# Patient Record
Sex: Female | Born: 1970 | Race: White | Hispanic: No | Marital: Married | Smoking: Former smoker
Health system: Southern US, Community
[De-identification: ages and names within clinical notes are randomized; demographics above are authoritative.]

## PROBLEM LIST (undated history)

## (undated) DIAGNOSIS — E079 Disorder of thyroid, unspecified: Secondary | ICD-10-CM

## (undated) DIAGNOSIS — E785 Hyperlipidemia, unspecified: Secondary | ICD-10-CM

## (undated) HISTORY — DX: Hyperlipidemia, unspecified: E78.5

## (undated) HISTORY — PX: CYST REMOVAL HAND: SHX6279

## (undated) HISTORY — PX: OVARIAN CYST REMOVAL: SHX89

## (undated) HISTORY — PX: APPENDECTOMY: SHX54

## (undated) HISTORY — DX: Disorder of thyroid, unspecified: E07.9

## (undated) HISTORY — PX: CHOLECYSTECTOMY: SHX55

---

## 2019-10-15 ENCOUNTER — Encounter: Payer: Self-pay | Admitting: Internal Medicine

## 2019-10-15 ENCOUNTER — Ambulatory Visit (INDEPENDENT_AMBULATORY_CARE_PROVIDER_SITE_OTHER): Payer: No Typology Code available for payment source | Admitting: Internal Medicine

## 2019-10-15 ENCOUNTER — Other Ambulatory Visit: Payer: Self-pay

## 2019-10-15 VITALS — BP 132/84 | HR 96 | Resp 16 | Ht 71.0 in | Wt 220.0 lb

## 2019-10-15 DIAGNOSIS — Z1159 Encounter for screening for other viral diseases: Secondary | ICD-10-CM

## 2019-10-15 DIAGNOSIS — Z114 Encounter for screening for human immunodeficiency virus [HIV]: Secondary | ICD-10-CM

## 2019-10-15 DIAGNOSIS — E782 Mixed hyperlipidemia: Secondary | ICD-10-CM

## 2019-10-15 DIAGNOSIS — R233 Spontaneous ecchymoses: Secondary | ICD-10-CM

## 2019-10-15 DIAGNOSIS — E669 Obesity, unspecified: Secondary | ICD-10-CM | POA: Diagnosis not present

## 2019-10-15 DIAGNOSIS — Z7689 Persons encountering health services in other specified circumstances: Secondary | ICD-10-CM | POA: Diagnosis not present

## 2019-10-15 DIAGNOSIS — Z1231 Encounter for screening mammogram for malignant neoplasm of breast: Secondary | ICD-10-CM

## 2019-10-15 NOTE — Patient Instructions (Addendum)
Check CBC, CMP, lipid panel, A1c, HIV & hepatitis c screening  on Monday am with fasting Follow up in 4 weeks-will do pap smear on next visit Lost 60 lb--proud of you!!! Keep doing your hard work.

## 2019-10-15 NOTE — Progress Notes (Addendum)
New Patient Office Visit  Subjective:  Patient ID: Terri Flores, female    DOB: 08-20-1970  Age: 49 y.o. MRN: [id number]  CC:  Chief Complaint  Patient presents with  . New Patient (Initial Visit)    establish care  . Rash    rash on right foot/may be stipling has been there x 1 year    HPI Terri Flores is 49 year old female with past medical history of mixed hyperlipidemia, obesity presents in our clinic for the first time to establish care with [place].  Patient tells me that she moved recently from [place] in July 2020.  She has elevated total cholesterol, triglycerides and LDL however she does not take any medication.  She is working on diet and exercise.  She tells me that she has lost 60 pounds in 1 year.  She takes multiple over-the-counter vitamins.  She tells me that she has a rash on both feet since 1 year.  No aggravating or relieving factors, denies association with pain, itching, swelling, fever, chills, trauma.  She does not have history of ITP/bleeding tendency/does not take over-the-counter NSAID.  She is not on anticoagulation or family history of bleeding disorder.  She does not smoke cigarettes, drinks alcohol or illicit drug use.  She is sexually active with one female partner, no history of STD.  She has no kids.  LMP: 17th July.  She exercises 90 minutes every day.  She is up-to-date on COVID-19 vaccine.  She is due for Pap smear and mammogram.  Past Medical History:  Diagnosis Date  . Hyperlipidemia     Past Surgical History:  Procedure Laterality Date  . APPENDECTOMY    . CHOLECYSTECTOMY    . CYST REMOVAL HAND Left   . OVARIAN CYST REMOVAL Bilateral     Family History  Problem Relation Age of Onset  . Obesity Mother   . Diabetes Mother   . Hyperlipidemia Mother   . Obesity Sister   . Cancer Brother   . Hyperlipidemia Brother   . Obesity Brother     Social History   Socioeconomic History  . Marital status: Married    Spouse name: Not on file    . Number of children: Not on file  . Years of education: Not on file  . Highest education level: Not on file  Occupational History  . Not on file  Tobacco Use  . Smoking status: Former Games developer  . Smokeless tobacco: Never Used  Substance and Sexual Activity  . Alcohol use: Not Currently  . Drug use: Never  . Sexual activity: Not Currently  Other Topics Concern  . Not on file  Social History Narrative  . Not on file   Social Determinants of Health   Financial Resource Strain:   . Difficulty of Paying Living Expenses:   Food Insecurity:   . Worried About Programme researcher, broadcasting/film/video in the Last Year:   . Barista in the Last Year:   Transportation Needs:   . Freight forwarder (Medical):   Marland Kitchen Lack of Transportation (Non-Medical):   Physical Activity:   . Days of Exercise per Week:   . Minutes of Exercise per Session:   Stress:   . Feeling of Stress :   Social Connections:   . Frequency of Communication with Friends and Family:   . Frequency of Social Gatherings with Friends and Family:   . Attends Religious Services:   . Active Member of Clubs or Organizations:   .  Attends Banker Meetings:   Marland Kitchen Marital Status:   Intimate Partner Violence:   . Fear of Current or Ex-Partner:   . Emotionally Abused:   Marland Kitchen Physically Abused:   . Sexually Abused:     ROS Review of Systems  Constitutional: Negative.   HENT: Negative.   Eyes: Negative.   Respiratory: Negative.   Cardiovascular: Negative.   Gastrointestinal: Negative.   Endocrine: Negative.   Genitourinary: Negative.   Musculoskeletal: Negative.   Skin: Positive for rash.  Allergic/Immunologic: Negative.   Neurological: Negative.   Hematological: Negative.   Psychiatric/Behavioral: Negative.     Objective:   Today's Vitals: BP (!) 132/84   Pulse 96   Resp 16   Ht 5\' 11"  (1.803 m)   Wt (!) 220 lb (99.8 kg)   SpO2 97%   BMI 30.68 kg/m   Physical Exam Constitutional:      Appearance: Normal  appearance.  HENT:     Head: Normocephalic and atraumatic.     Nose: Nose normal.     Mouth/Throat:     Mouth: Mucous membranes are moist.  Eyes:     Extraocular Movements: Extraocular movements intact.     Conjunctiva/sclera: Conjunctivae normal.     Pupils: Pupils are equal, round, and reactive to light.  Cardiovascular:     Rate and Rhythm: Normal rate and regular rhythm.     Pulses: Normal pulses.     Heart sounds: Normal heart sounds.  Pulmonary:     Effort: Pulmonary effort is normal.     Breath sounds: Normal breath sounds.  Abdominal:     General: Abdomen is flat. Bowel sounds are normal.     Palpations: Abdomen is soft.  Musculoskeletal:        General: Normal range of motion.     Cervical back: Normal range of motion and neck supple.  Skin:    Comments: Petechial/nonblanchable rash noted on extensor surface of both feet toes.  Nontender, no ankle swelling noted.  Neurological:     Mental Status: She is alert.     Assessment & Plan:   Problem List Items Addressed This Visit    None    Visit Diagnoses    Encounter to establish care    -  Primary   Mixed hyperlipidemia       Obesity (BMI 30.0-34.9)       Encounter for screening mammogram for breast cancer       Relevant Orders   MM Digital Screening   Petechial rash       Encounter for screening for HIV       Need for hepatitis C screening test         Encounter to establish care: Care established -Check CBC, CMP, lipid panel, A1c on Monday morning -PHQ-9 score: 1. Pt is doing well, no Hx of low/depressed mood.  Petechial rash: Nonblanchable on exam.  Nontender. -Involving toes of both feet -Unknown etiology.  Check CBC-check platelet count.  No history of ITP/bleeding disorder  Encounter for screening mammogram for breast cancer: Screening mammogram ordered  Mixed hyperlipidemia: Reviewed lipid panel from 06/08/2018 which showed total cholesterol of 240, triglycerides: 107, LDL: 174, HDL: 45. -Repeat  lipid panel today.  Obesity with BMI of 30.  Patient tells me that she has lost 60 pounds in 1 year -Continue diet modification, exercise and weight loss  Encounter for HIV/hepatitis C screening: Test ordered  Will perform Pap smear on next visit.  Outpatient Encounter Medications  as of 10/15/2019  Medication Sig  . B Complex Vitamins (B COMPLEX PO) Take 1 Dose by mouth daily.  . Cyanocobalamin (VITAMIN B-12 PO) Take 1 Dose by mouth daily.  Marland Kitchen MAGNESIUM PO Take 1 capsule by mouth daily.  Gerome Sam Oleifera (MORINGA PO) Take 100 mLs by mouth daily.  . Multiple Vitamin (MULTIVITAMIN ADULT PO) Take 1 tablet by mouth daily.  . Omega-3 Fatty Acids (OMEGA 3 PO) Take 1 Dose by mouth in the morning and at bedtime. W/ black pepper  . OVER THE COUNTER MEDICATION Take 1 Dose by mouth daily. Fiber Supplement  . OVER THE COUNTER MEDICATION Take 1 Dose by mouth daily. Glucosamine chondrotin  . pyridOXINE (VITAMIN B-6) 100 MG tablet Take 100 mg by mouth daily.  . Pyridoxine HCl (VITAMIN B-6 PO) Take 1 Dose by mouth daily.  . TURMERIC PO Take 1 Dose by mouth daily.   No facility-administered encounter medications on file as of 10/15/2019.    Follow-up: No follow-ups on file.   Ollen Bowl, MD

## 2019-10-19 LAB — CMP14+EGFR
ALT: 23 IU/L (ref 0–32)
AST: 19 IU/L (ref 0–40)
Albumin/Globulin Ratio: 1.7 (ref 1.2–2.2)
Albumin: 4.7 g/dL (ref 3.8–4.8)
Alkaline Phosphatase: 56 IU/L (ref 48–121)
BUN/Creatinine Ratio: 15 (ref 9–23)
BUN: 12 mg/dL (ref 6–24)
Bilirubin Total: 0.5 mg/dL (ref 0.0–1.2)
CO2: 23 mmol/L (ref 20–29)
Calcium: 9.6 mg/dL (ref 8.7–10.2)
Chloride: 100 mmol/L (ref 96–106)
Creatinine, Ser: 0.79 mg/dL (ref 0.57–1.00)
GFR calc Af Amer: 102 mL/min/{1.73_m2} (ref >59)
GFR calc non Af Amer: 89 mL/min/{1.73_m2} (ref >59)
Globulin, Total: 2.8 g/dL (ref 1.5–4.5)
Glucose: 95 mg/dL (ref 65–99)
Potassium: 4.1 mmol/L (ref 3.5–5.2)
Sodium: 137 mmol/L (ref 134–144)
Total Protein: 7.5 g/dL (ref 6.0–8.5)

## 2019-10-19 LAB — LIPID PANEL
Chol/HDL Ratio: 4.2 ratio (ref 0.0–4.4)
Cholesterol, Total: 284 mg/dL — ABNORMAL HIGH (ref 100–199)
HDL: 68 mg/dL (ref >39)
LDL Chol Calc (NIH): 193 mg/dL — ABNORMAL HIGH (ref 0–99)
Triglycerides: 128 mg/dL (ref 0–149)
VLDL Cholesterol Cal: 23 mg/dL (ref 5–40)

## 2019-10-19 LAB — CBC
Hematocrit: 42.9 % (ref 34.0–46.6)
Hemoglobin: 14.4 g/dL (ref 11.1–15.9)
MCH: 30.8 pg (ref 26.6–33.0)
MCHC: 33.6 g/dL (ref 31.5–35.7)
MCV: 92 fL (ref 79–97)
Platelets: 165 10*3/uL (ref 150–450)
RBC: 4.68 x10E6/uL (ref 3.77–5.28)
RDW: 11.8 % (ref 11.7–15.4)
WBC: 5.2 10*3/uL (ref 3.4–10.8)

## 2019-10-19 LAB — HEMOGLOBIN A1C
Est. average glucose Bld gHb Est-mCnc: 108 mg/dL
Hgb A1c MFr Bld: 5.4 % (ref 4.8–5.6)

## 2019-10-19 LAB — HEPATITIS C ANTIBODY: Hep C Virus Ab: <0.1 s/co ratio (ref 0.0–0.9)

## 2019-10-19 LAB — HIV ANTIBODY (ROUTINE TESTING W REFLEX): HIV Screen 4th Generation wRfx: NONREACTIVE

## 2019-10-26 ENCOUNTER — Telehealth (HOSPITAL_COMMUNITY): Payer: Self-pay | Admitting: Internal Medicine

## 2019-10-26 ENCOUNTER — Telehealth: Payer: Self-pay | Admitting: Family Medicine

## 2019-10-26 NOTE — Telephone Encounter (Signed)
Patient is returning a call regarding lab results please call back at [phone]

## 2019-10-26 NOTE — Telephone Encounter (Signed)
Pt aware of results 

## 2019-12-20 ENCOUNTER — Ambulatory Visit (INDEPENDENT_AMBULATORY_CARE_PROVIDER_SITE_OTHER): Payer: No Typology Code available for payment source | Admitting: Internal Medicine

## 2019-12-20 ENCOUNTER — Other Ambulatory Visit: Payer: Self-pay

## 2019-12-20 ENCOUNTER — Other Ambulatory Visit: Payer: Self-pay | Admitting: *Deleted

## 2019-12-20 ENCOUNTER — Encounter (INDEPENDENT_AMBULATORY_CARE_PROVIDER_SITE_OTHER): Payer: Self-pay | Admitting: *Deleted

## 2019-12-20 ENCOUNTER — Encounter: Payer: Self-pay | Admitting: Internal Medicine

## 2019-12-20 VITALS — BP 125/82 | HR 76 | Temp 97.5°F | Resp 16 | Ht 71.0 in | Wt 215.4 lb

## 2019-12-20 DIAGNOSIS — Z124 Encounter for screening for malignant neoplasm of cervix: Secondary | ICD-10-CM

## 2019-12-20 DIAGNOSIS — Z0000 Encounter for general adult medical examination without abnormal findings: Secondary | ICD-10-CM | POA: Diagnosis not present

## 2019-12-20 DIAGNOSIS — Z1211 Encounter for screening for malignant neoplasm of colon: Secondary | ICD-10-CM

## 2019-12-20 DIAGNOSIS — Z23 Encounter for immunization: Secondary | ICD-10-CM | POA: Diagnosis not present

## 2019-12-20 DIAGNOSIS — E669 Obesity, unspecified: Secondary | ICD-10-CM

## 2019-12-20 DIAGNOSIS — R233 Spontaneous ecchymoses: Secondary | ICD-10-CM

## 2019-12-20 DIAGNOSIS — Z0001 Encounter for general adult medical examination with abnormal findings: Secondary | ICD-10-CM | POA: Insufficient documentation

## 2019-12-20 DIAGNOSIS — E782 Mixed hyperlipidemia: Secondary | ICD-10-CM

## 2019-12-20 DIAGNOSIS — L309 Dermatitis, unspecified: Secondary | ICD-10-CM | POA: Diagnosis not present

## 2019-12-20 MED ORDER — MOMETASONE FUROATE 0.1 % EX CREA: TOPICAL_CREAM | CUTANEOUS | 1 refills | Status: DC | PRN

## 2019-12-20 NOTE — Patient Instructions (Addendum)
You are advised to continue to perform moderate exercise, 30 mins/day for at least 5 days in a week.  Please follow low-carbohydrate, low-cholesterol diet as discussed. We will repeat your blood tests before next visit.  Please apply lotion to avoid dry skin and sunscreen before outdoor activities.  You will be scheduled for Mammography.

## 2019-12-20 NOTE — Assessment & Plan Note (Signed)
Lipid profile discussed with the patient Advised to statin therapy, did not tolerate statin in the past, patient prefers diet modification for now Will recheck lipid profile before next visit

## 2019-12-20 NOTE — Assessment & Plan Note (Signed)
Unclear etiology, likely capillaritis, platelets wnl Unchanged from prior visit No signs of active bleeding or itching Will monitor for now

## 2019-12-20 NOTE — Assessment & Plan Note (Signed)
Borderline Advised low-carbohydrate, low-cholesterol diet and moderate exercise

## 2019-12-20 NOTE — Progress Notes (Signed)
Established Patient Office Visit  Subjective:  Patient ID: Terri Flores, female    DOB: [birth year]  Age: 49 y.o. MRN: [id number]  CC:  Chief Complaint  Patient presents with  . Follow-up    4 week follow up has spots on feet they were here last visit however they havent went away also would like to go over labs from last visit     HPI Terri Flores is 49 year old female with past medical history of hyperlipidemia, eczema and obesity presents for annual physical exam.  Patient complains of noticing brownish spots on the left foot, more pronounced on the medial side around the ankle and on the toes.  She states that they are not painful or itchy.  Patient denies any history of bleeding around the site or bruising.  She denies fever, chills, fatigue, night sweats, headache, neck pain, nausea, vomiting, constipation, diarrhea, abdominal pain, dysuria, hematuria, or LE edema.  Upon discussion of her lipid profile, patient states that she used to eat fish as a majority portion of her diet and her cholesterol numbers were getting better before she moved to this area.  She has been having more fried food recently.  She states that she had generalized fatigue with atorvastatin in the past and does not want to take cholesterol medication for now and prefers to do diet modification and exercise.  Patient has not had a mammography yet as she did not receive a call for scheduling it.  Past Medical History:  Diagnosis Date  . Hyperlipidemia     Past Surgical History:  Procedure Laterality Date  . APPENDECTOMY    . CHOLECYSTECTOMY    . CYST REMOVAL HAND Left   . OVARIAN CYST REMOVAL Bilateral     Family History  Problem Relation Age of Onset  . Obesity Mother   . Diabetes Mother   . Hyperlipidemia Mother   . Obesity Sister   . Cancer Brother   . Hyperlipidemia Brother   . Obesity Brother     Social History   Socioeconomic History  . Marital status: Married    Spouse name: Not  on file  . Number of children: Not on file  . Years of education: Not on file  . Highest education level: Not on file  Occupational History  . Not on file  Tobacco Use  . Smoking status: Former Research scientist (life sciences)  . Smokeless tobacco: Never Used  Substance and Sexual Activity  . Alcohol use: Not Currently  . Drug use: Never  . Sexual activity: Not Currently  Other Topics Concern  . Not on file  Social History Narrative  . Not on file   Social Determinants of Health   Financial Resource Strain:   . Difficulty of Paying Living Expenses: Not on file  Food Insecurity:   . Worried About Charity fundraiser in the Last Year: Not on file  . Ran Out of Food in the Last Year: Not on file  Transportation Needs:   . Lack of Transportation (Medical): Not on file  . Lack of Transportation (Non-Medical): Not on file  Physical Activity:   . Days of Exercise per Week: Not on file  . Minutes of Exercise per Session: Not on file  Stress:   . Feeling of Stress : Not on file  Social Connections:   . Frequency of Communication with Friends and Family: Not on file  . Frequency of Social Gatherings with Friends and Family: Not on file  . Attends Religious Services: Not  on file  . Active Member of Clubs or Organizations: Not on file  . Attends Archivist Meetings: Not on file  . Marital Status: Not on file  Intimate Partner Violence:   . Fear of Current or Ex-Partner: Not on file  . Emotionally Abused: Not on file  . Physically Abused: Not on file  . Sexually Abused: Not on file    Outpatient Medications Prior to Visit  Medication Sig Dispense Refill  . MAGNESIUM PO Take 1 capsule by mouth daily.    Edyth Gunnels Oleifera (MORINGA PO) Take 100 mLs by mouth daily.    . Multiple Vitamin (MULTIVITAMIN ADULT PO) Take 1 tablet by mouth daily.    . Omega-3 Fatty Acids (OMEGA 3 PO) Take 1 Dose by mouth in the morning and at bedtime. W/ black pepper    . OVER THE COUNTER MEDICATION Take 1 Dose by  mouth daily. Fiber Supplement    . OVER THE COUNTER MEDICATION Take 1 Dose by mouth daily. Glucosamine chondrotin    . TURMERIC PO Take 1 Dose by mouth daily.    . mometasone (ELOCON) 0.1 % cream     . B Complex Vitamins (B COMPLEX PO) Take 1 Dose by mouth daily.    . Cyanocobalamin (VITAMIN B-12 PO) Take 1 Dose by mouth daily. (Patient not taking: Reported on 12/20/2019)    . pyridOXINE (VITAMIN B-6) 100 MG tablet Take 100 mg by mouth daily.    . Pyridoxine HCl (VITAMIN B-6 PO) Take 1 Dose by mouth daily.     No facility-administered medications prior to visit.    Allergies  Allergen Reactions  . Other Nausea Only    Pain medication. Patient can't recall the name    ROS Review of Systems  Constitutional: Negative for chills and fever.  HENT: Negative for congestion, sinus pressure, sinus pain and sore throat.   Eyes: Negative for pain and discharge.  Respiratory: Negative for cough and shortness of breath.   Cardiovascular: Negative for chest pain and palpitations.  Gastrointestinal: Negative for abdominal pain, constipation, diarrhea, nausea and vomiting.  Endocrine: Negative for polydipsia and polyuria.  Genitourinary: Negative for dysuria and hematuria.  Musculoskeletal: Negative for neck pain and neck stiffness.  Skin: Positive for rash (Left foot).  Neurological: Negative for dizziness and weakness.  Psychiatric/Behavioral: Negative for agitation and behavioral problems.      Objective:    Physical Exam Vitals reviewed.  Constitutional:      General: She is not in acute distress.    Appearance: She is not diaphoretic.  HENT:     Head: Normocephalic and atraumatic.     Nose: Nose normal. No congestion.     Mouth/Throat:     Mouth: Mucous membranes are moist.     Pharynx: No posterior oropharyngeal erythema.  Eyes:     General: No scleral icterus.    Extraocular Movements: Extraocular movements intact.     Pupils: Pupils are equal, round, and reactive to light.    Cardiovascular:     Rate and Rhythm: Normal rate and regular rhythm.     Heart sounds: No murmur heard.   Pulmonary:     Breath sounds: Normal breath sounds. No wheezing or rales.  Abdominal:     Palpations: Abdomen is soft.     Tenderness: There is no abdominal tenderness.  Musculoskeletal:     Cervical back: Neck supple. No tenderness.     Right lower leg: No edema.     Left lower  leg: No edema.  Skin:    General: Skin is warm.     Comments: Left foot - Petechiae like spots noted on inferomedial side of knee and over toes, nonblanchable  Neurological:     General: No focal deficit present.     Mental Status: She is alert and oriented to person, place, and time.  Psychiatric:        Mood and Affect: Mood normal.        Behavior: Behavior normal.     BP 125/82 (BP Location: Right Arm, Patient Position: Sitting, Cuff Size: Normal)   Pulse 76   Temp (!) 97.5 F (36.4 C) (Temporal)   Resp 16   Ht _0  (1.803 m)   Wt 215 lb 6.4 oz (97.7 kg)   SpO2 97%   BMI 30.04 kg/m  Wt Readings from Last 3 Encounters:  12/20/19 215 lb 6.4 oz (97.7 kg)  10/15/19 (!) 220 lb (99.8 kg)     Health Maintenance Due  Topic Date Due  . TETANUS/TDAP  Never done  . PAP SMEAR-Modifier  Never done    There are no preventive care reminders to display for this patient.  No results found for: TSH Lab Results  Component Value Date   WBC 5.2 10/18/2019   HGB 14.4 10/18/2019   HCT 42.9 10/18/2019   MCV 92 10/18/2019   PLT 165 10/18/2019   Lab Results  Component Value Date   NA 137 10/18/2019   K 4.1 10/18/2019   CO2 23 10/18/2019   GLUCOSE 95 10/18/2019   BUN 12 10/18/2019   CREATININE 0.79 10/18/2019   BILITOT 0.5 10/18/2019   ALKPHOS 56 10/18/2019   AST 19 10/18/2019   ALT 23 10/18/2019   PROT 7.5 10/18/2019   ALBUMIN 4.7 10/18/2019   CALCIUM 9.6 10/18/2019   Lab Results  Component Value Date   CHOL 284 (H) 10/18/2019   Lab Results  Component Value Date   HDL 68  10/18/2019   Lab Results  Component Value Date   LDLCALC 193 (H) 10/18/2019   Lab Results  Component Value Date   TRIG 128 10/18/2019   Lab Results  Component Value Date   CHOLHDL 4.2 10/18/2019   Lab Results  Component Value Date   HGBA1C 5.4 10/18/2019      Assessment & Plan:   Problem List Items Addressed This Visit      Annual physical exam - Primary   Annual physical performed Preventive care discussed with the patient Needs Mammography, PAP smear and colonoscopy - referrals provided     Relevant Orders  CBC with Differential   Musculoskeletal and Integument   Petechial rash    Unclear etiology, likely capillaritis, platelets wnl Unchanged from prior visit No signs of active bleeding or itching Will monitor for now      Eczema    Well-controlled with Mometasone cream      Relevant Medications   mometasone (ELOCON) 0.1 % cream     Other   Mixed hyperlipidemia    Lipid profile discussed with the patient Advised to statin therapy, did not tolerate statin in the past, patient prefers diet modification for now Will recheck lipid profile before next visit      Relevant Orders   CMP14+EGFR   T4 AND TSH   Lipid Profile   Obesity (BMI 30.0-34.9)    Borderline Advised low-carbohydrate, low-cholesterol diet and moderate exercise  Other Visit Diagnoses    Need for immunization against influenza       Relevant Orders   Flu Vaccine QUAD 36+ mos IM (Completed)   Routine cervical smear       Relevant Orders   Ambulatory referral to Obstetrics / Gynecology   Special screening for malignant neoplasms, colon       Relevant Orders   Ambulatory referral to Gastroenterology      Meds ordered this encounter  Medications  . mometasone (ELOCON) 0.1 % cream    Sig: Apply topically as needed.    Dispense:  45 g    Refill:  1    Follow-up: Return in about 4 months (around 04/21/2020).    Lindell Spar, MD

## 2019-12-20 NOTE — Assessment & Plan Note (Addendum)
Annual exam as documented. Counseling done  re healthy lifestyle involving commitment to 150 minutes exercise per week, heart healthy diet, and attaining healthy weight.The importance of adequate sleep also discussed. Changes in health habits are decided on by the patient with goals and time frames  set for achieving them. Immunization and cancer screening needs are specifically addressed at this visit. Needs Mammography, PAP smear and colonoscopy - referrals provided

## 2019-12-20 NOTE — Assessment & Plan Note (Signed)
Well-controlled with Mometasone cream 

## 2020-01-05 ENCOUNTER — Encounter: Payer: Self-pay | Admitting: Adult Health

## 2020-01-05 ENCOUNTER — Ambulatory Visit (INDEPENDENT_AMBULATORY_CARE_PROVIDER_SITE_OTHER): Payer: No Typology Code available for payment source | Admitting: Adult Health

## 2020-01-05 ENCOUNTER — Other Ambulatory Visit (HOSPITAL_COMMUNITY)
Admission: RE | Admit: 2020-01-05 | Discharge: 2020-01-05 | Disposition: A | Discharge status: Home / Self Care | Payer: No Typology Code available for payment source | Source: Ambulatory Visit | Attending: Adult Health | Admitting: Adult Health

## 2020-01-05 VITALS — BP 136/93 | HR 88 | Ht 70.5 in | Wt 214.0 lb

## 2020-01-05 DIAGNOSIS — Z01419 Encounter for gynecological examination (general) (routine) without abnormal findings: Secondary | ICD-10-CM | POA: Insufficient documentation

## 2020-01-05 DIAGNOSIS — R232 Flushing: Secondary | ICD-10-CM | POA: Insufficient documentation

## 2020-01-05 DIAGNOSIS — N951 Menopausal and female climacteric states: Secondary | ICD-10-CM | POA: Diagnosis not present

## 2020-01-05 DIAGNOSIS — Z1211 Encounter for screening for malignant neoplasm of colon: Secondary | ICD-10-CM

## 2020-01-05 LAB — HEMOCCULT GUIAC POC 1CARD (OFFICE): Fecal Occult Blood, POC: NEGATIVE

## 2020-01-05 NOTE — Progress Notes (Signed)
°  Subjective:     Patient ID: Terri Flores, female   DOB: 10/20/1970, 49 y.o.   MRN: [id number]  HPI Terri Flores is a 49 year old white female,married,G0P0, in for a pelvic and pap, she had her physical and labs with PCP. She is complaining of irregular periods, some hot flashes and night sweats and insomnia and body aches. She started taking turmeric and moringa and feels much better.  PCP is Dr Allena Katz.  Review of Systems Patient denies any headaches, hearing loss, fatigue, blurred vision, shortness of breath, chest pain, abdominal pain, problems with bowel movements, urination, or intercourse(not currently active). No joint pain or mood swings. See HPI for positives.    Objective:   Physical Exam BP (!) 136/93 (BP Location: Left Arm, Patient Position: Sitting, Cuff Size: Normal)    Pulse 88    Ht 5' 10.5" (1.791 m)    Wt 214 lb (97.1 kg)    LMP 12/21/2019    BMI 30.27 kg/m   Skin warm and dry.Pelvic: external genitalia is normal in appearance no lesions, vagina: pink with good good moisture and rugae,urethra has no lesions or masses noted, cervix:smooth,pap with high risk HPV genotyping performed, uterus: normal size, shape and contour, non tender, no masses felt, adnexa: no masses or tenderness noted. Bladder is non tender and no masses felt.On rectal exam, has good tone, no masses and hemoccult is negative. Examination chaperoned by Malachy Mood LPN Fall risk is low PHQ 9 score is 1  [facility] - 01/05/20 0917      Pregnancy Intention Screening   Does the patient want to become pregnant in the next year? No    Does the patient's partner want to become pregnant in the next year? No    Would the patient like to discuss contraceptive options today? Yes      Contraception Wrap Up   Current Method Abstinence             Assessment:     1. Encounter for gynecological examination with Papanicolaou smear of cervix Pap sent Physical in 1 year with PCP Pap in 3 if normal Mammogram in  November and yearly Labs with PCP Advised ti get colonoscopy   2. Encounter for screening fecal occult blood testing   3. Perimenopause Discussed symptoms of perimenopause and menopause with her, discussed HRT briefly Review handout on menopause     Plan:     Pap in 3 years if normal Call anytime if perimenopause symptoms get worse

## 2020-01-05 NOTE — Patient Instructions (Signed)
Menopause Menopause is the normal time of life when menstrual periods stop completely. It is usually confirmed by 12 months without a menstrual period. The transition to menopause (perimenopause) most often happens between the ages of 45 and 55. During perimenopause, hormone levels change in your body, which can cause symptoms and affect your health. Menopause may increase your risk for:  Loss of bone (osteoporosis), which causes bone breaks (fractures).  Depression.  Hardening and narrowing of the arteries (atherosclerosis), which can cause heart attacks and strokes. What are the causes? This condition is usually caused by a natural change in hormone levels that happens as you get older. The condition may also be caused by surgery to remove both ovaries (bilateral oophorectomy). What increases the risk? This condition is more likely to start at an earlier age if you have certain medical conditions or treatments, including:  A tumor of the pituitary gland in the brain.  A disease that affects the ovaries and hormone production.  Radiation treatment for cancer.  Certain cancer treatments, such as chemotherapy or hormone (anti-estrogen) therapy.  Heavy smoking and excessive alcohol use.  Family history of early menopause. This condition is also more likely to develop earlier in women who are very thin. What are the signs or symptoms? Symptoms of this condition include:  Hot flashes.  Irregular menstrual periods.  Night sweats.  Changes in feelings about sex. This could be a decrease in sex drive or an increased comfort around your sexuality.  Vaginal dryness and thinning of the vaginal walls. This may cause painful intercourse.  Dryness of the skin and development of wrinkles.  Headaches.  Problems sleeping (insomnia).  Mood swings or irritability.  Memory problems.  Weight gain.  Hair growth on the face and chest.  Bladder infections or problems with urinating. How  is this diagnosed? This condition is diagnosed based on your medical history, a physical exam, your age, your menstrual history, and your symptoms. Hormone tests may also be done. How is this treated? In some cases, no treatment is needed. You and your health care provider should make a decision together about whether treatment is necessary. Treatment will be based on your individual condition and preferences. Treatment for this condition focuses on managing symptoms. Treatment may include:  Menopausal hormone therapy (MHT).  Medicines to treat specific symptoms or complications.  Acupuncture.  Vitamin or herbal supplements. Before starting treatment, make sure to let your health care provider know if you have a personal or family history of:  Heart disease.  Breast cancer.  Blood clots.  Diabetes.  Osteoporosis. Follow these instructions at home: Lifestyle  Do not use any products that contain nicotine or tobacco, such as cigarettes and e-cigarettes. If you need help quitting, ask your health care provider.  Get at least 30 minutes of physical activity on 5 or more days each week.  Avoid alcoholic and caffeinated beverages, as well as spicy foods. This may help prevent hot flashes.  Get 7-8 hours of sleep each night.  If you have hot flashes, try: ? Dressing in layers. ? Avoiding things that may trigger hot flashes, such as spicy food, warm places, or stress. ? Taking slow, deep breaths when a hot flash starts. ? Keeping a fan in your home and office.  Find ways to manage stress, such as deep breathing, meditation, or journaling.  Consider going to group therapy with other women who are having menopause symptoms. Ask your health care provider about recommended group therapy meetings. Eating and   drinking  Eat a healthy, balanced diet that contains whole grains, lean protein, low-fat dairy, and plenty of fruits and vegetables.  Your health care provider may recommend  adding more soy to your diet. Foods that contain soy include tofu, tempeh, and soy milk.  Eat plenty of foods that contain calcium and vitamin D for bone health. Items that are rich in calcium include low-fat milk, yogurt, beans, almonds, sardines, broccoli, and kale. Medicines  Take over-the-counter and prescription medicines only as told by your health care provider.  Talk with your health care provider before starting any herbal supplements. If prescribed, take vitamins and supplements as told by your health care provider. These may include: ? Calcium. Women age 51 and older should get 1,200 mg (milligrams) of calcium every day. ? Vitamin D. Women need 600-800 International Units of vitamin D each day. ? Vitamins B12 and B6. Aim for 50 micrograms of B12 and 1.5 mg of B6 each day. General instructions  Keep track of your menstrual periods, including: ? When they occur. ? How heavy they are and how long they last. ? How much time passes between periods.  Keep track of your symptoms, noting when they start, how often you have them, and how long they last.  Use vaginal lubricants or moisturizers to help with vaginal dryness and improve comfort during sex.  Keep all follow-up visits as told by your health care provider. This is important. This includes any group therapy or counseling. Contact a health care provider if:  You are still having menstrual periods after age 55.  You have pain during sex.  You have not had a period for 12 months and you develop vaginal bleeding. Get help right away if:  You have: ? Severe depression. ? Excessive vaginal bleeding. ? Pain when you urinate. ? A fast or irregular heart beat (palpitations). ? Severe headaches. ? Abdomen (abdominal) pain or severe indigestion.  You fell and you think you have a broken bone.  You develop leg or chest pain.  You develop vision problems.  You feel a lump in your breast. Summary  Menopause is the normal  time of life when menstrual periods stop completely. It is usually confirmed by 12 months without a menstrual period.  The transition to menopause (perimenopause) most often happens between the ages of 45 and 55.  Symptoms can be managed through medicines, lifestyle changes, and complementary therapies such as acupuncture.  Eat a balanced diet that is rich in nutrients to promote bone health and heart health and to manage symptoms during menopause. This information is not intended to replace advice given to you by your health care provider. Make sure you discuss any questions you have with your health care provider. Document Revised: 02/14/2017 Document Reviewed: 04/06/2016 Elsevier Patient Education  2020 [employer]  

## 2020-01-07 LAB — CYTOLOGY - PAP
Adequacy: ABSENT
Comment: NEGATIVE
Diagnosis: NEGATIVE
High risk HPV: NEGATIVE

## 2020-01-21 ENCOUNTER — Other Ambulatory Visit: Payer: Self-pay

## 2020-01-21 ENCOUNTER — Ambulatory Visit
Admission: RE | Admit: 2020-01-21 | Discharge: 2020-01-21 | Disposition: A | Discharge status: Home / Self Care | Payer: No Typology Code available for payment source | Source: Ambulatory Visit | Attending: Internal Medicine | Admitting: Internal Medicine

## 2020-01-21 DIAGNOSIS — Z1231 Encounter for screening mammogram for malignant neoplasm of breast: Secondary | ICD-10-CM

## 2020-04-17 ENCOUNTER — Other Ambulatory Visit: Payer: Self-pay

## 2020-04-17 ENCOUNTER — Other Ambulatory Visit: Payer: Self-pay | Admitting: *Deleted

## 2020-04-17 DIAGNOSIS — Z0000 Encounter for general adult medical examination without abnormal findings: Secondary | ICD-10-CM

## 2020-04-17 DIAGNOSIS — E669 Obesity, unspecified: Secondary | ICD-10-CM

## 2020-04-17 DIAGNOSIS — E782 Mixed hyperlipidemia: Secondary | ICD-10-CM

## 2020-04-17 NOTE — Addendum Note (Signed)
Addended by: Abner Greenspan on: 04/17/2020 08:55 AM   Modules accepted: Orders

## 2020-04-18 LAB — CMP14+EGFR
ALT: 28 IU/L (ref 0–32)
AST: 19 IU/L (ref 0–40)
Albumin/Globulin Ratio: 1.7 (ref 1.2–2.2)
Albumin: 4.5 g/dL (ref 3.8–4.8)
Alkaline Phosphatase: 61 IU/L (ref 44–121)
BUN/Creatinine Ratio: 10 (ref 9–23)
BUN: 8 mg/dL (ref 6–24)
Bilirubin Total: 0.4 mg/dL (ref 0.0–1.2)
CO2: 22 mmol/L (ref 20–29)
Calcium: 9.4 mg/dL (ref 8.7–10.2)
Chloride: 100 mmol/L (ref 96–106)
Creatinine, Ser: 0.78 mg/dL (ref 0.57–1.00)
GFR calc Af Amer: 103 mL/min/{1.73_m2} (ref >59)
GFR calc non Af Amer: 90 mL/min/{1.73_m2} (ref >59)
Globulin, Total: 2.7 g/dL (ref 1.5–4.5)
Glucose: 90 mg/dL (ref 65–99)
Potassium: 4.4 mmol/L (ref 3.5–5.2)
Sodium: 136 mmol/L (ref 134–144)
Total Protein: 7.2 g/dL (ref 6.0–8.5)

## 2020-04-18 LAB — T4 AND TSH
T4, Total: 6.1 ug/dL (ref 4.5–12.0)
TSH: 3.76 u[IU]/mL (ref 0.450–4.500)

## 2020-04-18 LAB — CBC
Hematocrit: 41.3 % (ref 34.0–46.6)
Hemoglobin: 14.2 g/dL (ref 11.1–15.9)
MCH: 31.1 pg (ref 26.6–33.0)
MCHC: 34.4 g/dL (ref 31.5–35.7)
MCV: 90 fL (ref 79–97)
Platelets: 190 10*3/uL (ref 150–450)
RBC: 4.57 x10E6/uL (ref 3.77–5.28)
RDW: 11.5 % — ABNORMAL LOW (ref 11.7–15.4)
WBC: 7.9 10*3/uL (ref 3.4–10.8)

## 2020-04-18 LAB — LIPID PANEL
Chol/HDL Ratio: 3.4 ratio (ref 0.0–4.4)
Cholesterol, Total: 196 mg/dL (ref 100–199)
HDL: 58 mg/dL (ref >39)
LDL Chol Calc (NIH): 115 mg/dL — ABNORMAL HIGH (ref 0–99)
Triglycerides: 130 mg/dL (ref 0–149)
VLDL Cholesterol Cal: 23 mg/dL (ref 5–40)

## 2020-04-21 ENCOUNTER — Encounter: Payer: Self-pay | Admitting: Internal Medicine

## 2020-04-21 ENCOUNTER — Other Ambulatory Visit: Payer: Self-pay

## 2020-04-21 ENCOUNTER — Telehealth (INDEPENDENT_AMBULATORY_CARE_PROVIDER_SITE_OTHER): Payer: No Typology Code available for payment source | Admitting: Internal Medicine

## 2020-04-21 VITALS — Ht 70.5 in | Wt 214.0 lb

## 2020-04-21 DIAGNOSIS — L309 Dermatitis, unspecified: Secondary | ICD-10-CM

## 2020-04-21 DIAGNOSIS — E782 Mixed hyperlipidemia: Secondary | ICD-10-CM

## 2020-04-21 DIAGNOSIS — E669 Obesity, unspecified: Secondary | ICD-10-CM

## 2020-04-21 NOTE — Patient Instructions (Addendum)
Please continue to follow low cholesterol diet and perform moderate exercise/walking at least 150 mins/week.  Please continue to use Mometasone cream for eczema.

## 2020-04-21 NOTE — Progress Notes (Signed)
Virtual Visit via Telephone Note   This visit type was conducted due to national recommendations for restrictions regarding the COVID-19 Pandemic (e.g. social distancing) in an effort to limit this patient's exposure and mitigate transmission in our community.  Due to her co-morbid illnesses, this patient is at least at moderate risk for complications without adequate follow up.  This format is felt to be most appropriate for this patient at this time.  The patient did not have access to video technology/had technical difficulties with video requiring transitioning to audio format only (telephone).  All issues noted in this document were discussed and addressed.  No physical exam could be performed with this format.   Evaluation Performed:  Follow-up visit  Date:  04/21/2020   ID:  Terri Flores, DOB 11-08-1970, MRN [id number]  Patient Location: Home Provider Location: Home Office  Participants: Patient Location of Patient: Home Location of Provider: Telehealth Consent was obtain for visit to be over via telehealth. I verified that I am speaking with the correct person using two identifiers.  PCP:  Anabel Halon, MD   Chief Complaint:  HLD follow up  History of Present Illness:    Terri Flores is a 50 y.o. female with medical history of hyperlipidemia, eczema and obesity presents for follow up of HLD and review of blood tests.  She has been doing well overall.  Her eczema has been worse due to seasonal allergies recently, but she states that it responds to steroid cream. She denies any nasal congestion, dyspnea or wheezing.  She had dental visit recently, and is going to get dental guard soon.  Her cholesterol profile has improved significantly and is continuing to follow low cholesterol diet and perform exercises. She has lost about 2-3 lbs since the last visit. Other blood tests were reviewed and discussed with the patient in detail.  The patient does not have symptoms  concerning for COVID-19 infection (fever, chills, cough, or new shortness of breath).   Past Medical, Surgical, Social History, Allergies, and Medications have been Reviewed.  Past Medical History:  Diagnosis Date  . Hyperlipidemia    Past Surgical History:  Procedure Laterality Date  . APPENDECTOMY    . CHOLECYSTECTOMY    . CYST REMOVAL HAND Left   . OVARIAN CYST REMOVAL Bilateral      Current Meds  Medication Sig  . MAGNESIUM PO Take 1 capsule by mouth daily.  . mometasone (ELOCON) 0.1 % cream Apply topically as needed.  Gerome Sam Oleifera (MORINGA PO) Take 1 mL by mouth daily.   . Multiple Vitamin (MULTIVITAMIN ADULT PO) Take 1 tablet by mouth daily.  . Omega-3 Fatty Acids (OMEGA 3 PO) Take 1 Dose by mouth in the morning and at bedtime. W/ black pepper  . OVER THE COUNTER MEDICATION Take 1 Dose by mouth daily. Fiber Supplement  . OVER THE COUNTER MEDICATION Take 1 Dose by mouth daily. Glucosamine chondrotin  . TURMERIC PO Take 1 Dose by mouth daily.     Allergies:   Other   ROS:   Please see the history of present illness.     All other systems reviewed and are negative.   Labs/Other Tests and Data Reviewed:    Recent Labs: 04/17/2020: ALT 28; BUN 8; Creatinine, Ser 0.78; Hemoglobin 14.2; Platelets 190; Potassium 4.4; Sodium 136; TSH 3.760   Recent Lipid Panel Lab Results  Component Value Date/Time   CHOL 196 04/17/2020 09:18 AM   TRIG 130 04/17/2020 09:18 AM  HDL 58 04/17/2020 09:18 AM   CHOLHDL 3.4 04/17/2020 09:18 AM   LDLCALC 115 (H) 04/17/2020 09:18 AM    Wt Readings from Last 3 Encounters:  04/21/20 214 lb (97.1 kg)  01/05/20 214 lb (97.1 kg)  12/20/19 215 lb 6.4 oz (97.7 kg)      ASSESSMENT & PLAN:    HLD Better now with only diet modification Continue low cholesterol diet and moderate exercise  Obesity No significant weight loss, but she is willing to continue low carbohydrate and low cholesterol diet Continue moderate  exercise  Eczema Continue Mometasone  Time:   Today, I have spent 12 minutes reviewing the chart, including problem list, medications, and with the patient with telehealth technology discussing the above problems.   Medication Adjustments/Labs and Tests Ordered: Current medicines are reviewed at length with the patient today.  Concerns regarding medicines are outlined above.   Tests Ordered: No orders of the defined types were placed in this encounter.   Medication Changes: No orders of the defined types were placed in this encounter.    Note: This dictation was prepared with Dragon dictation along with smaller phrase technology. Similar sounding words can be transcribed inadequately or may not be corrected upon review. Any transcriptional errors that result from this process are unintentional.      Disposition:  Follow up  Signed, Anabel Halon, MD  04/21/2020 8:34 AM     [facility]

## 2020-11-21 NOTE — Telephone Encounter (Signed)
Noted  

## 2021-02-19 ENCOUNTER — Encounter: Payer: No Typology Code available for payment source | Admitting: Internal Medicine

## 2021-05-22 ENCOUNTER — Encounter: Payer: Self-pay | Admitting: Internal Medicine

## 2021-05-22 ENCOUNTER — Ambulatory Visit (INDEPENDENT_AMBULATORY_CARE_PROVIDER_SITE_OTHER): Payer: No Typology Code available for payment source | Admitting: Internal Medicine

## 2021-05-22 ENCOUNTER — Other Ambulatory Visit: Payer: Self-pay

## 2021-05-22 VITALS — BP 124/82 | HR 96 | Resp 18 | Ht 71.0 in | Wt 230.8 lb

## 2021-05-22 DIAGNOSIS — R232 Flushing: Secondary | ICD-10-CM | POA: Diagnosis not present

## 2021-05-22 DIAGNOSIS — Z23 Encounter for immunization: Secondary | ICD-10-CM | POA: Diagnosis not present

## 2021-05-22 DIAGNOSIS — E782 Mixed hyperlipidemia: Secondary | ICD-10-CM

## 2021-05-22 DIAGNOSIS — E559 Vitamin D deficiency, unspecified: Secondary | ICD-10-CM | POA: Diagnosis not present

## 2021-05-22 DIAGNOSIS — Z0001 Encounter for general adult medical examination with abnormal findings: Secondary | ICD-10-CM | POA: Diagnosis not present

## 2021-05-22 NOTE — Patient Instructions (Signed)
Please continue to follow low cholesterol diet and perform moderate exercise/walking at least 150 mins/week. 

## 2021-05-22 NOTE — Assessment & Plan Note (Signed)
Annual exam as documented. ?Counseling done  re healthy lifestyle involving commitment to 150 minutes exercise per week, heart healthy diet, and attaining healthy weight.The importance of adequate sleep also discussed. ?Changes in health habits are decided on by the patient with goals and time frames  set for achieving them. ?Immunization and cancer screening needs are specifically addressed at this visit. ? ?Had home stool test, likely cologuard in 2022. ?Had Shingrix #1 vaccine today. ?

## 2021-05-22 NOTE — Addendum Note (Signed)
Addended by: Ishmael Holter R on: 05/22/2021 01:46 PM ? ? Modules accepted: Orders ? ?

## 2021-05-22 NOTE — Assessment & Plan Note (Signed)
Does not prefer HRT or SSRI 

## 2021-05-22 NOTE — Progress Notes (Signed)
Established Patient Office Visit  Subjective:  Patient ID: Terri Flores, female    DOB: [birth year]  Age: 51 y.o. MRN: [id number]  CC:  Chief Complaint  Patient presents with   Annual Exam    Annual exam pt is premenopausal and having hot flashes also has hip pain all the time     HPI Terri Flores is a 51 y.o. female who presents for annual physical.  She complains of hot flashes at times.  She does not prefer to do HRT or SSRI for now.  She complains of b/l hip pain for the last 4 years, which is better with Turmeric supplement for now.  She used to take ibuprofen, but has stopped taking it now.  Denies any numbness, tingling or weakness of the LE.  She takes multiple supplements including herbal supplements to reduce cholesterol.   Past Medical History:  Diagnosis Date   Hyperlipidemia     Past Surgical History:  Procedure Laterality Date   APPENDECTOMY     CHOLECYSTECTOMY     CYST REMOVAL HAND Left    OVARIAN CYST REMOVAL Bilateral     Family History  Problem Relation Age of Onset   Obesity Mother    Diabetes Mother    Hyperlipidemia Mother    Other Father        brain tumor   Obesity Sister    Cancer Brother    Hyperlipidemia Brother    Obesity Brother    Heart attack Paternal Grandfather    Heart attack Maternal Grandmother    Other Maternal Grandfather        severe joint issues   Other Paternal Aunt        brain tumor   Other Paternal Aunt        esophagus "exploded"    Social History   Socioeconomic History   Marital status: Married    Spouse name: Not on file   Number of children: Not on file   Years of education: Not on file   Highest education level: Not on file  Occupational History   Not on file  Tobacco Use   Smoking status: Former    Types: Cigarettes   Smokeless tobacco: Never  Vaping Use   Vaping Use: Never used  Substance and Sexual Activity   Alcohol use: Yes    Comment: "once in a blue moon"   Drug use: Never    Sexual activity: Not Currently    Birth control/protection: None  Other Topics Concern   Not on file  Social History Narrative   Not on file   Social Determinants of Health   Financial Resource Strain: Not on file  Food Insecurity: Not on file  Transportation Needs: Not on file  Physical Activity: Not on file  Stress: Not on file  Social Connections: Not on file  Intimate Partner Violence: Not on file    Outpatient Medications Prior to Visit  Medication Sig Dispense Refill   Ascorbic Acid (VITAMIN C) 1000 MG tablet Take 1,000 mg by mouth daily.     Coenzyme Q10 (COQ10) 100 MG CAPS Take by mouth. Takes 190mg      Cyanocobalamin 5000 MCG/ML LIQD Place under the tongue. Takes 2500     MAGNESIUM PO Take 1 capsule by mouth daily.     mometasone (ELOCON) 0.1 % cream Apply topically as needed. 45 g 1   Moringa Oleifera (MORINGA PO) Take 1 mL by mouth daily.      Multiple Vitamin (MULTIVITAMIN ADULT  PO) Take 1 tablet by mouth daily.     Omega-3 Fatty Acids (OMEGA 3 PO) Take 1 Dose by mouth in the morning and at bedtime. W/ black pepper     OVER THE COUNTER MEDICATION Take 1 Dose by mouth daily. Fiber Supplement     OVER THE COUNTER MEDICATION Take 1 Dose by mouth daily. Glucosamine chondrotin     TURMERIC PO Take 1 Dose by mouth daily.     VITAMIN D-VITAMIN K PO Take by mouth. Takes     UNABLE TO FIND Estraval-2 pills in the am     No facility-administered medications prior to visit.    Allergies  Allergen Reactions   Other Nausea Only    Pain medication. Patient can't recall the name    ROS Review of Systems  Constitutional:  Negative for chills and fever.  HENT:  Negative for congestion, sinus pressure, sinus pain and sore throat.   Eyes:  Negative for pain and discharge.  Respiratory:  Negative for cough and shortness of breath.   Cardiovascular:  Negative for chest pain and palpitations.  Gastrointestinal:  Negative for abdominal pain, constipation, diarrhea, nausea  and vomiting.  Endocrine: Negative for polydipsia and polyuria.       Hot flashes  Genitourinary:  Negative for dysuria and hematuria.  Musculoskeletal:  Positive for arthralgias (B/l hip pain). Negative for neck pain and neck stiffness.  Skin:  Negative for rash.  Neurological:  Negative for dizziness and weakness.  Psychiatric/Behavioral:  Negative for agitation and behavioral problems.      Objective:    Physical Exam Vitals reviewed.  Constitutional:      General: She is not in acute distress.    Appearance: She is obese. She is not diaphoretic.  HENT:     Head: Normocephalic and atraumatic.     Nose: Nose normal. No congestion.     Mouth/Throat:     Mouth: Mucous membranes are moist.     Pharynx: No posterior oropharyngeal erythema.  Eyes:     General: No scleral icterus.    Extraocular Movements: Extraocular movements intact.  Cardiovascular:     Rate and Rhythm: Normal rate and regular rhythm.     Pulses: Normal pulses.     Heart sounds: Normal heart sounds. No murmur heard. Pulmonary:     Breath sounds: Normal breath sounds. No wheezing or rales.  Abdominal:     Palpations: Abdomen is soft.     Tenderness: There is no abdominal tenderness.  Musculoskeletal:     Cervical back: Neck supple. No tenderness.     Right lower leg: No edema.     Left lower leg: No edema.  Skin:    General: Skin is warm.     Findings: No erythema.  Neurological:     General: No focal deficit present.     Mental Status: She is alert and oriented to person, place, and time.     Cranial Nerves: No cranial nerve deficit.     Sensory: No sensory deficit.     Motor: No weakness.  Psychiatric:        Mood and Affect: Mood normal.        Behavior: Behavior normal.    BP 124/82 (BP Location: Left Arm, Patient Position: Sitting, Cuff Size: Normal)    Pulse 96    Resp 18    Ht 5\' 11"  (1.803 m)    Wt 230 lb 12.8 oz (104.7 kg)    SpO2 95%  BMI 32.19 kg/m  Wt Readings from Last 3 Encounters:   05/22/21 230 lb 12.8 oz (104.7 kg)  04/21/20 214 lb (97.1 kg)  01/05/20 214 lb (97.1 kg)    Lab Results  Component Value Date   TSH 3.760 04/17/2020   Lab Results  Component Value Date   WBC 7.9 04/17/2020   HGB 14.2 04/17/2020   HCT 41.3 04/17/2020   MCV 90 04/17/2020   PLT 190 04/17/2020   Lab Results  Component Value Date   NA 136 04/17/2020   K 4.4 04/17/2020   CO2 22 04/17/2020   GLUCOSE 90 04/17/2020   BUN 8 04/17/2020   CREATININE 0.78 04/17/2020   BILITOT 0.4 04/17/2020   ALKPHOS 61 04/17/2020   AST 19 04/17/2020   ALT 28 04/17/2020   PROT 7.2 04/17/2020   ALBUMIN 4.5 04/17/2020   CALCIUM 9.4 04/17/2020   Lab Results  Component Value Date   CHOL 196 04/17/2020   Lab Results  Component Value Date   HDL 58 04/17/2020   Lab Results  Component Value Date   LDLCALC 115 (H) 04/17/2020   Lab Results  Component Value Date   TRIG 130 04/17/2020   Lab Results  Component Value Date   CHOLHDL 3.4 04/17/2020   Lab Results  Component Value Date   HGBA1C 5.4 10/18/2019      Assessment & Plan:   Problem List Items Addressed This Visit    Encounter for general adult medical examination with abnormal findings Annual exam as documented. Counseling done  re healthy lifestyle involving commitment to 150 minutes exercise per week, heart healthy diet, and attaining healthy weight.The importance of adequate sleep also discussed. Changes in health habits are decided on by the patient with goals and time frames  set for achieving them. Immunization and cancer screening needs are specifically addressed at this visit.  Had home stool test, likely cologuard in 2022. Had Shingrix #1 vaccine today.  Mixed hyperlipidemia Cholesterol had improved with diet modification Check lipid profile today  Hot flashes Does not prefer HRT or SSRI    Other Visit Diagnoses     Vitamin D deficiency       Relevant Orders   VITAMIN D 25 Hydroxy (Vit-D Deficiency,  Fractures)   Need for immunization against influenza       Relevant Orders   Flu Vaccine QUAD 30mo+IM (Fluarix, Fluzone & Alfiuria Quad PF) (Completed)       No orders of the defined types were placed in this encounter.   Follow-up: Return in about 1 year (around 05/23/2022) for Annual physical.    Anabel Halon, MD

## 2021-05-22 NOTE — Assessment & Plan Note (Signed)
Cholesterol had improved with diet modification Check lipid profile today 

## 2021-05-23 LAB — CMP14+EGFR
ALT: 17 IU/L (ref 0–32)
AST: 17 IU/L (ref 0–40)
Albumin/Globulin Ratio: 1.8 (ref 1.2–2.2)
Albumin: 4.7 g/dL (ref 3.8–4.8)
Alkaline Phosphatase: 62 IU/L (ref 44–121)
BUN/Creatinine Ratio: 21 (ref 9–23)
BUN: 13 mg/dL (ref 6–24)
Bilirubin Total: 0.3 mg/dL (ref 0.0–1.2)
CO2: 25 mmol/L (ref 20–29)
Calcium: 9.6 mg/dL (ref 8.7–10.2)
Chloride: 103 mmol/L (ref 96–106)
Creatinine, Ser: 0.63 mg/dL (ref 0.57–1.00)
Globulin, Total: 2.6 g/dL (ref 1.5–4.5)
Glucose: 91 mg/dL (ref 70–99)
Potassium: 4.1 mmol/L (ref 3.5–5.2)
Sodium: 143 mmol/L (ref 134–144)
Total Protein: 7.3 g/dL (ref 6.0–8.5)
eGFR: 108 mL/min/{1.73_m2} (ref >59)

## 2021-05-23 LAB — CBC WITH DIFFERENTIAL/PLATELET
Basophils Absolute: 0 10*3/uL (ref 0.0–0.2)
Basos: 0 %
EOS (ABSOLUTE): 0.1 10*3/uL (ref 0.0–0.4)
Eos: 2 %
Hematocrit: 42.5 % (ref 34.0–46.6)
Hemoglobin: 14.5 g/dL (ref 11.1–15.9)
Immature Grans (Abs): 0 10*3/uL (ref 0.0–0.1)
Immature Granulocytes: 0 %
Lymphocytes Absolute: 2 10*3/uL (ref 0.7–3.1)
Lymphs: 25 %
MCH: 31.1 pg (ref 26.6–33.0)
MCHC: 34.1 g/dL (ref 31.5–35.7)
MCV: 91 fL (ref 79–97)
Monocytes Absolute: 0.4 10*3/uL (ref 0.1–0.9)
Monocytes: 6 %
Neutrophils Absolute: 5.3 10*3/uL (ref 1.4–7.0)
Neutrophils: 67 %
Platelets: 174 10*3/uL (ref 150–450)
RBC: 4.66 x10E6/uL (ref 3.77–5.28)
RDW: 11.8 % (ref 11.7–15.4)
WBC: 7.9 10*3/uL (ref 3.4–10.8)

## 2021-05-23 LAB — TSH: TSH: 4.5 u[IU]/mL (ref 0.450–4.500)

## 2021-05-23 LAB — LIPID PANEL
Chol/HDL Ratio: 3.9 ratio (ref 0.0–4.4)
Cholesterol, Total: 264 mg/dL — ABNORMAL HIGH (ref 100–199)
HDL: 68 mg/dL (ref >39)
LDL Chol Calc (NIH): 163 mg/dL — ABNORMAL HIGH (ref 0–99)
Triglycerides: 181 mg/dL — ABNORMAL HIGH (ref 0–149)
VLDL Cholesterol Cal: 33 mg/dL (ref 5–40)

## 2021-05-23 LAB — VITAMIN D 25 HYDROXY (VIT D DEFICIENCY, FRACTURES): Vit D, 25-Hydroxy: 28.7 ng/mL — ABNORMAL LOW (ref 30.0–100.0)

## 2021-05-23 LAB — HEMOGLOBIN A1C
Est. average glucose Bld gHb Est-mCnc: 117 mg/dL
Hgb A1c MFr Bld: 5.7 % — ABNORMAL HIGH (ref 4.8–5.6)

## 2021-11-22 ENCOUNTER — Ambulatory Visit: Payer: No Typology Code available for payment source

## 2022-01-07 LAB — FECAL OCCULT BLOOD, GUAIAC: Fecal Occult Blood: NEGATIVE

## 2022-01-07 LAB — FECAL OCCULT BLOOD, IMMUNOCHEMICAL: IFOBT: NEGATIVE

## 2022-05-22 ENCOUNTER — Other Ambulatory Visit: Payer: Self-pay | Admitting: Internal Medicine

## 2022-05-22 ENCOUNTER — Telehealth: Payer: Self-pay | Admitting: Internal Medicine

## 2022-05-22 DIAGNOSIS — R7303 Prediabetes: Secondary | ICD-10-CM

## 2022-05-22 DIAGNOSIS — Z0001 Encounter for general adult medical examination with abnormal findings: Secondary | ICD-10-CM

## 2022-05-22 DIAGNOSIS — E559 Vitamin D deficiency, unspecified: Secondary | ICD-10-CM

## 2022-05-22 DIAGNOSIS — E782 Mixed hyperlipidemia: Secondary | ICD-10-CM

## 2022-05-22 DIAGNOSIS — R232 Flushing: Secondary | ICD-10-CM

## 2022-05-22 NOTE — Telephone Encounter (Signed)
CPE appt 3/25. Wants labs ordered and will do before visit

## 2022-05-22 NOTE — Telephone Encounter (Signed)
Patient called in wants to get lab orders put in before visit.  Wants a  call back in regard.

## 2022-05-27 ENCOUNTER — Encounter: Payer: No Typology Code available for payment source | Admitting: Internal Medicine

## 2022-06-10 ENCOUNTER — Encounter: Payer: Self-pay | Admitting: Internal Medicine

## 2022-06-10 ENCOUNTER — Ambulatory Visit (INDEPENDENT_AMBULATORY_CARE_PROVIDER_SITE_OTHER): Payer: No Typology Code available for payment source | Admitting: Internal Medicine

## 2022-06-10 VITALS — BP 148/90 | HR 78 | Ht 71.0 in | Wt 219.4 lb

## 2022-06-10 DIAGNOSIS — E559 Vitamin D deficiency, unspecified: Secondary | ICD-10-CM

## 2022-06-10 DIAGNOSIS — Z0001 Encounter for general adult medical examination with abnormal findings: Secondary | ICD-10-CM

## 2022-06-10 DIAGNOSIS — E782 Mixed hyperlipidemia: Secondary | ICD-10-CM | POA: Diagnosis not present

## 2022-06-10 DIAGNOSIS — L309 Dermatitis, unspecified: Secondary | ICD-10-CM

## 2022-06-10 DIAGNOSIS — J309 Allergic rhinitis, unspecified: Secondary | ICD-10-CM

## 2022-06-10 DIAGNOSIS — Z23 Encounter for immunization: Secondary | ICD-10-CM | POA: Diagnosis not present

## 2022-06-10 DIAGNOSIS — R7303 Prediabetes: Secondary | ICD-10-CM

## 2022-06-10 DIAGNOSIS — R232 Flushing: Secondary | ICD-10-CM

## 2022-06-10 DIAGNOSIS — R030 Elevated blood-pressure reading, without diagnosis of hypertension: Secondary | ICD-10-CM | POA: Insufficient documentation

## 2022-06-10 MED ORDER — AZELASTINE HCL 0.1 % NA SOLN: 2.0000 | Freq: Two times a day (BID) | NASAL | 2 refills | Status: DC

## 2022-06-10 MED ORDER — LEVOCETIRIZINE DIHYDROCHLORIDE 5 MG PO TABS: 5.0000 mg | ORAL_TABLET | Freq: Every evening | ORAL | 5 refills | Status: DC

## 2022-06-10 MED ORDER — MOMETASONE FUROATE 0.1 % EX CREA: TOPICAL_CREAM | CUTANEOUS | 1 refills | Status: DC | PRN

## 2022-06-10 NOTE — Patient Instructions (Addendum)
Please start taking Xyzal once daily for allergies. Please use Azelastine nasal spray as needed for nasal congestion and allergies.  Okay to use vaporizer for sinusitis as well.  Please avoid using Sudafed more than 5 days in a row.  Please continue to follow low carb diet and ambulate as tolerated.

## 2022-06-10 NOTE — Assessment & Plan Note (Signed)
Well-controlled with Mometasone cream

## 2022-06-10 NOTE — Progress Notes (Signed)
Established Patient Office Visit  Subjective:  Patient ID: Terri Flores, female    DOB: 1971-01-16  Age: 52 y.o. MRN: [id number]  CC:  Chief Complaint  Patient presents with   Annual Exam    Patient is having sinus problems and dry eyes, she is taking otc items and it is not helping.     HPI Terri Flores is a 52 y.o. female who presents for annual physical.  She complains of nasal congestion, sinus pressure related headache, postnasal drip and dry eyes, which are chronic.  She has been using Sudafed and oxymetazoline nasal spray without much relief.  She has also tried Systane eyedrops for dry eyes.  She denies any fever, chills, dyspnea or wheezing currently.  She complains of b/l hip pain for the last 4 years, which is better with Turmeric supplement for now.  She used to take ibuprofen, but has stopped taking it now.  Denies any numbness, tingling or weakness of the LE.  She takes multiple supplements including herbal supplements to reduce cholesterol.   Past Medical History:  Diagnosis Date   Hyperlipidemia     Past Surgical History:  Procedure Laterality Date   APPENDECTOMY     CHOLECYSTECTOMY     CYST REMOVAL HAND Left    OVARIAN CYST REMOVAL Bilateral     Family History  Problem Relation Age of Onset   Obesity Mother    Diabetes Mother    Hyperlipidemia Mother    Other Father        brain tumor   Obesity Sister    Cancer Brother    Hyperlipidemia Brother    Obesity Brother    Heart attack Paternal Grandfather    Heart attack Maternal Grandmother    Other Maternal Grandfather        severe joint issues   Other Paternal Aunt        brain tumor   Other Paternal Aunt        esophagus "exploded"    Social History   Socioeconomic History   Marital status: Married    Spouse name: Not on file   Number of children: Not on file   Years of education: Not on file   Highest education level: Not on file  Occupational History   Not on file  Tobacco  Use   Smoking status: Former    Types: Cigarettes   Smokeless tobacco: Never  Vaping Use   Vaping Use: Never used  Substance and Sexual Activity   Alcohol use: Yes    Comment: "once in a blue moon"   Drug use: Never   Sexual activity: Not Currently    Birth control/protection: None  Other Topics Concern   Not on file  Social History Narrative   Not on file   Social Determinants of Health   Financial Resource Strain: Low Risk  (01/05/2020)   Overall Financial Resource Strain (CARDIA)    Difficulty of Paying Living Expenses: Not hard at all  Food Insecurity: No Food Insecurity (01/05/2020)   Hunger Vital Sign    Worried About Running Out of Food in the Last Year: Never true    Ran Out of Food in the Last Year: Never true  Transportation Needs: No Transportation Needs (01/05/2020)   PRAPARE - Hydrologist (Medical): No    Lack of Transportation (Non-Medical): No  Physical Activity: Sufficiently Active (01/05/2020)   Exercise Vital Sign    Days of Exercise per Week: 7 days  Minutes of Exercise per Session: 60 min  Stress: No Stress Concern Present (01/05/2020)   [place]    Feeling of Stress : Only a little  Social Connections: Socially Isolated (01/05/2020)   Social Connection and Isolation Panel [NHANES]    Frequency of Communication with Friends and Family: Once a week    Frequency of Social Gatherings with Friends and Family: Never    Attends Religious Services: Never    Marine scientist or Organizations: No    Attends Archivist Meetings: Never    Marital Status: Married  Human resources officer Violence: Not At Risk (01/05/2020)   Humiliation, Afraid, Rape, and Kick questionnaire    Fear of Current or Ex-Partner: No    Emotionally Abused: No    Physically Abused: No    Sexually Abused: No    Outpatient Medications Prior to Visit  Medication Sig Dispense  Refill   Chromium 200 MCG TABS Take by mouth.     Coenzyme Q10 (COQ10) 100 MG CAPS Take by mouth. Takes 190mg      Cyanocobalamin 5000 MCG/ML LIQD Place under the tongue. Takes 2500     ferrous sulfate 324 MG TBEC Take 324 mg by mouth.     MAGNESIUM PO Take 1 capsule by mouth daily.     Moringa Oleifera (MORINGA PO) Take 1 mL by mouth daily.      Multiple Vitamin (MULTIVITAMIN ADULT PO) Take 1 tablet by mouth daily.     Omega-3 Fatty Acids (OMEGA 3 PO) Take 1 Dose by mouth in the morning and at bedtime. W/ black pepper     OVER THE COUNTER MEDICATION Take 1 Dose by mouth daily. Fiber Supplement     OVER THE COUNTER MEDICATION Take 1 each by mouth daily. Hair Skin and Nails     OVER THE COUNTER MEDICATION Take 1 each by mouth daily. Estrogen Vitamins     Red Yeast Rice 600 MG TABS Take 2 each by mouth daily.     TURMERIC PO Take 1 Dose by mouth daily.     VITAMIN D-VITAMIN K PO Take by mouth. Takes 23mcg     mometasone (ELOCON) 0.1 % cream Apply topically as needed. 45 g 1   Ascorbic Acid (VITAMIN C) 1000 MG tablet Take 1,000 mg by mouth daily. (Patient not taking: Reported on 06/10/2022)     OVER THE COUNTER MEDICATION Take 1 Dose by mouth daily. Glucosamine chondrotin (Patient not taking: Reported on 06/10/2022)     No facility-administered medications prior to visit.    Allergies  Allergen Reactions   Other Nausea Only    Pain medication. Patient can't recall the name    ROS Review of Systems  Constitutional:  Negative for chills and fever.  HENT:  Positive for congestion, postnasal drip and sinus pressure. Negative for sore throat.   Eyes:  Negative for pain and discharge.  Respiratory:  Negative for cough and shortness of breath.   Cardiovascular:  Negative for chest pain and palpitations.  Gastrointestinal:  Negative for abdominal pain, constipation, diarrhea, nausea and vomiting.  Endocrine: Negative for polydipsia and polyuria.       Hot flashes  Genitourinary:  Negative for  dysuria and hematuria.  Musculoskeletal:  Positive for arthralgias (B/l hip pain). Negative for neck pain and neck stiffness.  Skin:  Negative for rash.  Neurological:  Negative for dizziness and weakness.  Psychiatric/Behavioral:  Negative for agitation and behavioral problems.  Objective:    Physical Exam Vitals reviewed.  Constitutional:      General: She is not in acute distress.    Appearance: She is obese. She is not diaphoretic.  HENT:     Head: Normocephalic and atraumatic.     Nose: Congestion present.     Right Sinus: Maxillary sinus tenderness present.     Left Sinus: Maxillary sinus tenderness present.     Mouth/Throat:     Mouth: Mucous membranes are moist.     Pharynx: No posterior oropharyngeal erythema.  Eyes:     General: No scleral icterus.    Extraocular Movements: Extraocular movements intact.  Cardiovascular:     Rate and Rhythm: Normal rate and regular rhythm.     Pulses: Normal pulses.     Heart sounds: Normal heart sounds. No murmur heard. Pulmonary:     Breath sounds: Normal breath sounds. No wheezing or rales.  Abdominal:     Palpations: Abdomen is soft.     Tenderness: There is no abdominal tenderness.  Musculoskeletal:     Cervical back: Neck supple. No tenderness.     Right lower leg: No edema.     Left lower leg: No edema.  Skin:    General: Skin is warm.     Findings: No erythema.  Neurological:     General: No focal deficit present.     Mental Status: She is alert and oriented to person, place, and time.     Cranial Nerves: No cranial nerve deficit.     Sensory: No sensory deficit.     Motor: No weakness.  Psychiatric:        Mood and Affect: Mood normal.        Behavior: Behavior normal.     BP (!) 148/90 (BP Location: Right Arm, Cuff Size: Normal)   Pulse 78   Ht 5\' 11"  (1.803 m)   Wt 219 lb 6.4 oz (99.5 kg)   SpO2 96%   BMI 30.60 kg/m  Wt Readings from Last 3 Encounters:  06/10/22 219 lb 6.4 oz (99.5 kg)  05/22/21  230 lb 12.8 oz (104.7 kg)  04/21/20 214 lb (97.1 kg)    Lab Results  Component Value Date   TSH 4.500 05/22/2021   Lab Results  Component Value Date   WBC 7.9 05/22/2021   HGB 14.5 05/22/2021   HCT 42.5 05/22/2021   MCV 91 05/22/2021   PLT 174 05/22/2021   Lab Results  Component Value Date   NA 143 05/22/2021   K 4.1 05/22/2021   CO2 25 05/22/2021   GLUCOSE 91 05/22/2021   BUN 13 05/22/2021   CREATININE 0.63 05/22/2021   BILITOT 0.3 05/22/2021   ALKPHOS 62 05/22/2021   AST 17 05/22/2021   ALT 17 05/22/2021   PROT 7.3 05/22/2021   ALBUMIN 4.7 05/22/2021   CALCIUM 9.6 05/22/2021   EGFR 108 05/22/2021   Lab Results  Component Value Date   CHOL 264 (H) 05/22/2021   Lab Results  Component Value Date   HDL 68 05/22/2021   Lab Results  Component Value Date   LDLCALC 163 (H) 05/22/2021   Lab Results  Component Value Date   TRIG 181 (H) 05/22/2021   Lab Results  Component Value Date   CHOLHDL 3.9 05/22/2021   Lab Results  Component Value Date   HGBA1C 5.7 (H) 05/22/2021      Assessment & Plan:   Problem List Items Addressed This Visit    Encounter  for general adult medical examination with abnormal findings Annual exam as documented. Counseling done  re healthy lifestyle involving commitment to 150 minutes exercise per week, heart healthy diet, and attaining healthy weight.The importance of adequate sleep also discussed. Changes in health habits are decided on by the patient with goals and time frames  set for achieving them. Immunization and cancer screening needs are specifically addressed at this visit.  Had home stool test, FIT in 2023. Had Shingrix #2 vaccine today.  Hot flashes Does not prefer HRT or SSRI  Mixed hyperlipidemia Cholesterol had improved with diet modification Check lipid profile today  Prediabetes Lab Results  Component Value Date   HGBA1C 5.7 (H) 05/22/2021   Advised to follow low-carb diet  Vitamin D  deficiency Advised to take vitamin D 2000 IU daily  Eczema Well-controlled with Mometasone cream  Elevated BP without diagnosis of hypertension BP Readings from Last 1 Encounters:  06/10/22 (!) 148/90   Likely elevated due to Sudafed, Advised to discontinue Sudafed for now Advised DASH diet and moderate exercise/walking, at least 150 mins/week    Meds ordered this encounter  Medications   levocetirizine (XYZAL) 5 MG tablet    Sig: Take 1 tablet (5 mg total) by mouth every evening.    Dispense:  30 tablet    Refill:  5   azelastine (ASTELIN) 0.1 % nasal spray    Sig: Place 2 sprays into both nostrils 2 (two) times daily. Use in each nostril as directed    Dispense:  30 mL    Refill:  2   mometasone (ELOCON) 0.1 % cream    Sig: Apply topically as needed.    Dispense:  45 g    Refill:  1    Follow-up: Return in about 6 weeks (around 07/22/2022) for HTN.    Lindell Spar, MD

## 2022-06-10 NOTE — Assessment & Plan Note (Signed)
Advised to take vitamin D 2000 IU daily 

## 2022-06-10 NOTE — Assessment & Plan Note (Addendum)
Annual exam as documented. Counseling done  re healthy lifestyle involving commitment to 150 minutes exercise per week, heart healthy diet, and attaining healthy weight.The importance of adequate sleep also discussed. Changes in health habits are decided on by the patient with goals and time frames  set for achieving them. Immunization and cancer screening needs are specifically addressed at this visit.  Had home stool test, FIT in 2023. Had Shingrix #2 vaccine today.

## 2022-06-10 NOTE — Assessment & Plan Note (Signed)
Does not prefer HRT or SSRI

## 2022-06-10 NOTE — Assessment & Plan Note (Signed)
Lab Results  Component Value Date   HGBA1C 5.7 (H) 05/22/2021   Advised to follow low-carb diet

## 2022-06-10 NOTE — Assessment & Plan Note (Signed)
BP Readings from Last 1 Encounters:  06/10/22 (!) 148/90   Likely elevated due to Sudafed, Advised to discontinue Sudafed for now Advised DASH diet and moderate exercise/walking, at least 150 mins/week

## 2022-06-10 NOTE — Assessment & Plan Note (Signed)
Cholesterol had improved with diet modification Check lipid profile today

## 2022-06-11 LAB — CBC WITH DIFFERENTIAL/PLATELET
Basophils Absolute: 0 10*3/uL (ref 0.0–0.2)
Basos: 1 %
EOS (ABSOLUTE): 0.1 10*3/uL (ref 0.0–0.4)
Eos: 1 %
Hematocrit: 41.9 % (ref 34.0–46.6)
Hemoglobin: 14 g/dL (ref 11.1–15.9)
Immature Grans (Abs): 0 10*3/uL (ref 0.0–0.1)
Immature Granulocytes: 0 %
Lymphocytes Absolute: 2.3 10*3/uL (ref 0.7–3.1)
Lymphs: 36 %
MCH: 31 pg (ref 26.6–33.0)
MCHC: 33.4 g/dL (ref 31.5–35.7)
MCV: 93 fL (ref 79–97)
Monocytes Absolute: 0.4 10*3/uL (ref 0.1–0.9)
Monocytes: 7 %
Neutrophils Absolute: 3.5 10*3/uL (ref 1.4–7.0)
Neutrophils: 55 %
Platelets: 179 10*3/uL (ref 150–450)
RBC: 4.51 x10E6/uL (ref 3.77–5.28)
RDW: 12.2 % (ref 11.7–15.4)
WBC: 6.4 10*3/uL (ref 3.4–10.8)

## 2022-06-11 LAB — HEMOGLOBIN A1C
Est. average glucose Bld gHb Est-mCnc: 120 mg/dL
Hgb A1c MFr Bld: 5.8 % — ABNORMAL HIGH (ref 4.8–5.6)

## 2022-06-11 LAB — CMP14+EGFR
ALT: 38 IU/L — ABNORMAL HIGH (ref 0–32)
AST: 29 IU/L (ref 0–40)
Albumin/Globulin Ratio: 1.7 (ref 1.2–2.2)
Albumin: 4.7 g/dL (ref 3.8–4.9)
Alkaline Phosphatase: 61 IU/L (ref 44–121)
BUN/Creatinine Ratio: 12 (ref 9–23)
BUN: 9 mg/dL (ref 6–24)
Bilirubin Total: 0.4 mg/dL (ref 0.0–1.2)
CO2: 22 mmol/L (ref 20–29)
Calcium: 9.7 mg/dL (ref 8.7–10.2)
Chloride: 98 mmol/L (ref 96–106)
Creatinine, Ser: 0.77 mg/dL (ref 0.57–1.00)
Globulin, Total: 2.8 g/dL (ref 1.5–4.5)
Glucose: 91 mg/dL (ref 70–99)
Potassium: 4.4 mmol/L (ref 3.5–5.2)
Sodium: 136 mmol/L (ref 134–144)
Total Protein: 7.5 g/dL (ref 6.0–8.5)
eGFR: 93 mL/min/{1.73_m2} (ref >59)

## 2022-06-11 LAB — LIPID PANEL
Chol/HDL Ratio: 3.5 ratio (ref 0.0–4.4)
Cholesterol, Total: 248 mg/dL — ABNORMAL HIGH (ref 100–199)
HDL: 71 mg/dL (ref >39)
LDL Chol Calc (NIH): 155 mg/dL — ABNORMAL HIGH (ref 0–99)
Triglycerides: 124 mg/dL (ref 0–149)
VLDL Cholesterol Cal: 22 mg/dL (ref 5–40)

## 2022-06-11 LAB — VITAMIN D 25 HYDROXY (VIT D DEFICIENCY, FRACTURES): Vit D, 25-Hydroxy: 63.6 ng/mL (ref 30.0–100.0)

## 2022-06-11 LAB — TSH: TSH: 5.15 u[IU]/mL — ABNORMAL HIGH (ref 0.450–4.500)

## 2022-06-18 ENCOUNTER — Other Ambulatory Visit: Payer: Self-pay | Admitting: Internal Medicine

## 2022-06-18 DIAGNOSIS — Z1231 Encounter for screening mammogram for malignant neoplasm of breast: Secondary | ICD-10-CM

## 2022-07-22 ENCOUNTER — Ambulatory Visit: Payer: No Typology Code available for payment source | Admitting: Internal Medicine

## 2022-07-26 ENCOUNTER — Encounter: Payer: Self-pay | Admitting: Internal Medicine

## 2022-07-26 ENCOUNTER — Ambulatory Visit
Admission: RE | Admit: 2022-07-26 | Discharge: 2022-07-26 | Disposition: A | Discharge status: Home / Self Care | Payer: No Typology Code available for payment source | Source: Ambulatory Visit | Attending: Internal Medicine | Admitting: Internal Medicine

## 2022-07-26 ENCOUNTER — Ambulatory Visit (INDEPENDENT_AMBULATORY_CARE_PROVIDER_SITE_OTHER): Payer: No Typology Code available for payment source | Admitting: Internal Medicine

## 2022-07-26 VITALS — BP 133/86 | HR 76 | Ht 71.0 in | Wt 212.0 lb

## 2022-07-26 DIAGNOSIS — E038 Other specified hypothyroidism: Secondary | ICD-10-CM

## 2022-07-26 DIAGNOSIS — E782 Mixed hyperlipidemia: Secondary | ICD-10-CM

## 2022-07-26 DIAGNOSIS — J309 Allergic rhinitis, unspecified: Secondary | ICD-10-CM

## 2022-07-26 DIAGNOSIS — Z1231 Encounter for screening mammogram for malignant neoplasm of breast: Secondary | ICD-10-CM

## 2022-07-26 DIAGNOSIS — R030 Elevated blood-pressure reading, without diagnosis of hypertension: Secondary | ICD-10-CM

## 2022-07-26 DIAGNOSIS — N951 Menopausal and female climacteric states: Secondary | ICD-10-CM

## 2022-07-26 DIAGNOSIS — E039 Hypothyroidism, unspecified: Secondary | ICD-10-CM | POA: Insufficient documentation

## 2022-07-26 MED ORDER — ESTRADIOL 0.05 MG/24HR TD PTTW
1.0000 | MEDICATED_PATCH | TRANSDERMAL | 5 refills | Status: DC
Start: 2022-07-29 — End: 2022-07-31

## 2022-07-26 NOTE — Patient Instructions (Addendum)
Please continue taking Xyzal for allergies.  Please start using Vivelle Dot patch for hot flashes.

## 2022-07-26 NOTE — Progress Notes (Signed)
Established Patient Office Visit  Subjective:  Patient ID: Terri Flores, female    DOB: May 12, 1970  Age: 52 y.o. MRN: [id number]  CC:  Chief Complaint  Patient presents with   Hypertension    Six week follow up. Patient says the right ear is itchy and burns.    HPI Terri Flores is a 51 y.o. female with past medical history of allergic sinusitis who presents for f/u of her chronic medical conditions.  She complained of nasal congestion, sinus pressure related headache, postnasal drip and dry eyes, which are chronic, but have improved with Xyzal and Astelin nasal spray. She has stopped using Sudafed and oxymetazoline nasal spray now. She has also tried Systane eyedrops for dry eyes. She denies any fever, chills, dyspnea or wheezing currently.  She reports right ear itchiness on the antitragus area, but denies any ear pain or discharge currently.  Her blood pressure is WNL today.  She has brought home blood pressure readings, which are also overall well controlled.  She denies any headache, dizziness, chest pain, dyspnea or palpitations.  She reports menopausal hot flashes, vaginal dryness and irritation.  She prefers to start HRT for it.  She agrees to try Vivelle-Dot patch for now.  Past Medical History:  Diagnosis Date   Hyperlipidemia     Past Surgical History:  Procedure Laterality Date   APPENDECTOMY     CHOLECYSTECTOMY     CYST REMOVAL HAND Left    OVARIAN CYST REMOVAL Bilateral     Family History  Problem Relation Age of Onset   Obesity Mother    Diabetes Mother    Hyperlipidemia Mother    Other Father        brain tumor   Obesity Sister    Cancer Brother    Hyperlipidemia Brother    Obesity Brother    Heart attack Paternal Grandfather    Heart attack Maternal Grandmother    Other Maternal Grandfather        severe joint issues   Other Paternal Aunt        brain tumor   Other Paternal Aunt        esophagus "exploded"    Social History    Socioeconomic History   Marital status: Married    Spouse name: Not on file   Number of children: Not on file   Years of education: Not on file   Highest education level: 12th grade  Occupational History   Not on file  Tobacco Use   Smoking status: Former    Types: Cigarettes   Smokeless tobacco: Never  Vaping Use   Vaping Use: Never used  Substance and Sexual Activity   Alcohol use: Yes    Comment: "once in a blue moon"   Drug use: Never   Sexual activity: Not Currently    Birth control/protection: None  Other Topics Concern   Not on file  Social History Narrative   Not on file   Social Determinants of Health   Financial Resource Strain: Low Risk  (07/25/2022)   Overall Financial Resource Strain (CARDIA)    Difficulty of Paying Living Expenses: Not very hard  Food Insecurity: No Food Insecurity (07/25/2022)   Hunger Vital Sign    Worried About Running Out of Food in the Last Year: Never true    Ran Out of Food in the Last Year: Never true  Transportation Needs: No Transportation Needs (07/25/2022)   PRAPARE - Administrator, Civil Service (Medical): No  Lack of Transportation (Non-Medical): No  Physical Activity: Sufficiently Active (07/25/2022)   Exercise Vital Sign    Days of Exercise per Week: 5 days    Minutes of Exercise per Session: 40 min  Stress: No Stress Concern Present (07/25/2022)   [employer] of Occupational Health - Occupational Stress Questionnaire    Feeling of Stress : Only a little  Social Connections: Socially Isolated (07/25/2022)   Social Connection and Isolation Panel [NHANES]    Frequency of Communication with Friends and Family: Once a week    Frequency of Social Gatherings with Friends and Family: Never    Attends Religious Services: Never    Database administrator or Organizations: No    Attends Engineer, structural: Not on file    Marital Status: Married  Catering manager Violence: Not At Risk (01/05/2020)    Humiliation, Afraid, Rape, and Kick questionnaire    Fear of Current or Ex-Partner: No    Emotionally Abused: No    Physically Abused: No    Sexually Abused: No    Outpatient Medications Prior to Visit  Medication Sig Dispense Refill   Ascorbic Acid (VITAMIN C) 1000 MG tablet Take 1,000 mg by mouth daily. (Patient not taking: Reported on 06/10/2022)     azelastine (ASTELIN) 0.1 % nasal spray Place 2 sprays into both nostrils 2 (two) times daily. Use in each nostril as directed 30 mL 2   Chromium 200 MCG TABS Take by mouth.     Coenzyme Q10 (COQ10) 100 MG CAPS Take by mouth. Takes 190mg      Cyanocobalamin 5000 MCG/ML LIQD Place under the tongue. Takes 2500     ferrous sulfate 324 MG TBEC Take 324 mg by mouth.     levocetirizine (XYZAL) 5 MG tablet Take 1 tablet (5 mg total) by mouth every evening. 30 tablet 5   MAGNESIUM PO Take 1 capsule by mouth daily.     mometasone (ELOCON) 0.1 % cream Apply topically as needed. 45 g 1   Moringa Oleifera (MORINGA PO) Take 1 mL by mouth daily.      Multiple Vitamin (MULTIVITAMIN ADULT PO) Take 1 tablet by mouth daily.     Omega-3 Fatty Acids (OMEGA 3 PO) Take 1 Dose by mouth in the morning and at bedtime. W/ black pepper     OVER THE COUNTER MEDICATION Take 1 Dose by mouth daily. Fiber Supplement     OVER THE COUNTER MEDICATION Take 1 Dose by mouth daily. Glucosamine chondrotin (Patient not taking: Reported on 06/10/2022)     OVER THE COUNTER MEDICATION Take 1 each by mouth daily. Hair Skin and Nails     OVER THE COUNTER MEDICATION Take 1 each by mouth daily. Estrogen Vitamins     Red Yeast Rice 600 MG TABS Take 2 each by mouth daily.     TURMERIC PO Take 1 Dose by mouth daily.     VITAMIN D-VITAMIN K PO Take by mouth. Takes     No facility-administered medications prior to visit.    Allergies  Allergen Reactions   Other Nausea Only    Pain medication. Patient can't recall the name    ROS Review of Systems  Constitutional:  Negative for  chills and fever.  HENT:  Positive for congestion. Negative for sinus pressure and sore throat.   Eyes:  Negative for pain and discharge.  Respiratory:  Negative for cough and shortness of breath.   Cardiovascular:  Negative for chest pain and palpitations.  Gastrointestinal:  Negative for abdominal pain, constipation, diarrhea, nausea and vomiting.  Endocrine: Negative for polydipsia and polyuria.       Hot flashes  Genitourinary:  Negative for dysuria and hematuria.  Musculoskeletal:  Positive for arthralgias (B/l hip pain). Negative for neck pain and neck stiffness.  Skin:  Negative for rash.  Neurological:  Negative for dizziness and weakness.  Psychiatric/Behavioral:  Negative for agitation and behavioral problems.       Objective:    Physical Exam Vitals reviewed.  Constitutional:      General: She is not in acute distress.    Appearance: She is not diaphoretic.  HENT:     Head: Normocephalic and atraumatic.     Nose: No congestion.     Right Sinus: Maxillary sinus tenderness present.     Left Sinus: Maxillary sinus tenderness present.     Mouth/Throat:     Mouth: Mucous membranes are moist.     Pharynx: No posterior oropharyngeal erythema.  Eyes:     General: No scleral icterus.    Extraocular Movements: Extraocular movements intact.  Cardiovascular:     Rate and Rhythm: Normal rate and regular rhythm.     Pulses: Normal pulses.     Heart sounds: Normal heart sounds. No murmur heard. Pulmonary:     Breath sounds: Normal breath sounds. No wheezing or rales.  Musculoskeletal:     Cervical back: Neck supple. No tenderness.     Right lower leg: No edema.     Left lower leg: No edema.  Skin:    General: Skin is warm.     Findings: No erythema.  Neurological:     General: No focal deficit present.     Mental Status: She is alert and oriented to person, place, and time.     Sensory: No sensory deficit.     Motor: No weakness.  Psychiatric:        Mood and Affect:  Mood normal.        Behavior: Behavior normal.     BP 133/86 (BP Location: Right Arm, Patient Position: Sitting, Cuff Size: Normal)   Pulse 76   Ht 5\' 11"  (1.803 m)   Wt 212 lb (96.2 kg)   SpO2 96%   BMI 29.57 kg/m  Wt Readings from Last 3 Encounters:  07/26/22 212 lb (96.2 kg)  06/10/22 219 lb 6.4 oz (99.5 kg)  05/22/21 230 lb 12.8 oz (104.7 kg)    Lab Results  Component Value Date   TSH 5.150 (H) 06/10/2022   Lab Results  Component Value Date   WBC 6.4 06/10/2022   HGB 14.0 06/10/2022   HCT 41.9 06/10/2022   MCV 93 06/10/2022   PLT 179 06/10/2022   Lab Results  Component Value Date   NA 136 06/10/2022   K 4.4 06/10/2022   CO2 22 06/10/2022   GLUCOSE 91 06/10/2022   BUN 9 06/10/2022   CREATININE 0.77 06/10/2022   BILITOT 0.4 06/10/2022   ALKPHOS 61 06/10/2022   AST 29 06/10/2022   ALT 38 (H) 06/10/2022   PROT 7.5 06/10/2022   ALBUMIN 4.7 06/10/2022   CALCIUM 9.7 06/10/2022   EGFR 93 06/10/2022   Lab Results  Component Value Date   CHOL 248 (H) 06/10/2022   Lab Results  Component Value Date   HDL 71 06/10/2022   Lab Results  Component Value Date   LDLCALC 155 (H) 06/10/2022   Lab Results  Component Value Date   TRIG 124 06/10/2022  Lab Results  Component Value Date   CHOLHDL 3.5 06/10/2022   Lab Results  Component Value Date   HGBA1C 5.8 (H) 06/10/2022      Assessment & Plan:   Problem List Items Addressed This Visit       Cardiovascular and Mediastinum   Hot flashes    Discussed about HRT Agreed to try Vivelle-Dot patch Referred to OB/GYN for further discussion for menopausal symptoms      Relevant Medications   estradiol (VIVELLE-DOT) 0.05 MG/24HR patch (Start on 07/29/2022)     Respiratory   Allergic sinusitis    Now improved with Xyzal and azelastine nasal spray        Endocrine   Subclinical hypothyroidism    Lab Results  Component Value Date   TSH 5.150 (H) 06/10/2022  Overall asymptomatic Check TSH and free  T4      Relevant Orders   TSH + free T4     Other   Mixed hyperlipidemia    Cholesterol had improved with diet modification, she prefers to current continue low-cholesterol diet for now Checked lipid profile      Relevant Orders   Lipid Profile   Prehypertension - Primary    BP Readings from Last 1 Encounters:  07/26/22 133/86  Overall stable Advised DASH diet and moderate exercise/walking, at least 150 mins/week        Meds ordered this encounter  Medications   estradiol (VIVELLE-DOT) 0.05 MG/24HR patch    Sig: Place 1 patch (0.05 mg total) onto the skin 2 (two) times a week.    Dispense:  8 patch    Refill:  5    Follow-up: Return in about 6 months (around 01/26/2023) for Allergic sinusitis and menopausal hot flashes.    Anabel Halon, MD

## 2022-07-26 NOTE — Assessment & Plan Note (Addendum)
Cholesterol had improved with diet modification, she prefers to current continue low-cholesterol diet for now Checked lipid profile

## 2022-07-26 NOTE — Assessment & Plan Note (Signed)
Now improved with Xyzal and azelastine nasal spray

## 2022-07-26 NOTE — Assessment & Plan Note (Signed)
Discussed about HRT Agreed to try Vivelle-Dot patch Referred to OB/GYN for further discussion for menopausal symptoms

## 2022-07-26 NOTE — Assessment & Plan Note (Addendum)
BP Readings from Last 1 Encounters:  07/26/22 133/86   Overall stable Advised DASH diet and moderate exercise/walking, at least 150 mins/week

## 2022-07-26 NOTE — Assessment & Plan Note (Signed)
Lab Results  Component Value Date   TSH 5.150 (H) 06/10/2022   Overall asymptomatic Check TSH and free T4

## 2022-07-31 ENCOUNTER — Ambulatory Visit: Payer: No Typology Code available for payment source | Admitting: Adult Health

## 2022-07-31 ENCOUNTER — Encounter: Payer: Self-pay | Admitting: Adult Health

## 2022-07-31 VITALS — BP 125/77 | HR 69 | Ht 71.0 in | Wt 215.0 lb

## 2022-07-31 DIAGNOSIS — Z780 Asymptomatic menopausal state: Secondary | ICD-10-CM

## 2022-07-31 DIAGNOSIS — Z79890 Hormone replacement therapy (postmenopausal): Secondary | ICD-10-CM | POA: Diagnosis not present

## 2022-07-31 DIAGNOSIS — G479 Sleep disorder, unspecified: Secondary | ICD-10-CM | POA: Diagnosis not present

## 2022-07-31 DIAGNOSIS — R232 Flushing: Secondary | ICD-10-CM

## 2022-07-31 DIAGNOSIS — R4189 Other symptoms and signs involving cognitive functions and awareness: Secondary | ICD-10-CM

## 2022-07-31 DIAGNOSIS — N926 Irregular menstruation, unspecified: Secondary | ICD-10-CM

## 2022-07-31 MED ORDER — DUAVEE 0.45-20 MG PO TABS: 1.0000 | ORAL_TABLET | Freq: Every day | ORAL | 6 refills | Status: DC

## 2022-07-31 NOTE — Progress Notes (Signed)
  Subjective:     Patient ID: Terri Flores, female   DOB: 02-28-1971, 52 y.o.   MRN: [id number]  HPI Taydra is a 52 year old white female,married, G0P0, has not had a period since November and is having hot flashes, mental fog, and not sleeping well. She started Vivelle dot 0.05 mg Saturday from PCP, no progestin.      Component Value Date/Time   DIAGPAP  01/05/2020 0914    - Negative for intraepithelial lesion or malignancy (NILM)   HPVHIGH Negative 01/05/2020 0914   ADEQPAP  01/05/2020 0914    Satisfactory for evaluation; transformation zone component ABSENT.   PCP is Dr Allena Katz.  Review of Systems No period since November and is having hot flashes, mental fog, and not sleeping well.   She had seasonal depression and anxiety longer this year than usual. Reviewed past medical,surgical, social and family history. Reviewed medications and allergies.  Objective:   Physical Exam BP 125/77 (BP Location: Left Arm, Patient Position: Sitting, Cuff Size: Large)   Pulse 69   Ht 5\' 11"  (1.803 m)   Wt 215 lb (97.5 kg)   BMI 29.99 kg/m   Skin warm and dry. Lungs: clear to ausculation bilaterally. Cardiovascular: regular rate and rhythm.    AA is never Fall risk is low    07/26/2022    9:26 AM 06/10/2022    1:37 PM 05/22/2021    1:06 PM  Depression screen PHQ 2/9  Decreased Interest 0 0 0  Down, Depressed, Hopeless 0 0 0  PHQ - 2 Score 0 0 0     Upstream - 07/31/22 1040       Pregnancy Intention Screening   Does the patient want to become pregnant in the next year? No    Does the patient's partner want to become pregnant in the next year? No    Would the patient like to discuss contraceptive options today? No      Contraception Wrap Up   Current Method No Method - Other Reason    Reason for No Current Contraceptive Method at Intake ([facility] Only) Other    End Method No Method - Other Reason    Contraception Counseling Provided No             Assessment:      1. Hot  flashes +hot flashes Will try HRT  2. Brain fog Eat less processed foods Take to get outside and walk  3. Sleep disturbance   4. Hormone replacement therapy (HRT) Denies MI,stroke,DVT or breast cancer Discussed staying on patch and adding Prometrium or trying [facility] which is breast protective, and wants to try tht Will rx duavee Meds ordered this encounter  Medications   Conj Estrogens-Bazedoxifene (DUAVEE) 0.45-20 MG TABS    Sig: Take 1 tablet by mouth daily.    Dispense:  30 tablet    Refill:  6    Order Specific Question:   Supervising Provider    Answer:   Duane Lope H [[id number]]    Follow up in 8 weeks for ROS   5. Missed periods No period since November  6. Menopause Review handout     Will start Duavee  Plan:     Follow up in 10 weeks for ROS

## 2022-09-25 ENCOUNTER — Ambulatory Visit: Payer: No Typology Code available for payment source | Admitting: Adult Health

## 2022-09-25 ENCOUNTER — Encounter: Payer: Self-pay | Admitting: Adult Health

## 2022-09-25 VITALS — BP 122/86 | HR 77 | Ht 71.0 in | Wt 211.0 lb

## 2022-09-25 DIAGNOSIS — Z79890 Hormone replacement therapy (postmenopausal): Secondary | ICD-10-CM | POA: Diagnosis not present

## 2022-09-25 DIAGNOSIS — R232 Flushing: Secondary | ICD-10-CM | POA: Diagnosis not present

## 2022-09-25 MED ORDER — DUAVEE 0.45-20 MG PO TABS: 1.0000 | ORAL_TABLET | Freq: Every day | ORAL | 6 refills | Status: DC

## 2022-09-25 NOTE — Progress Notes (Addendum)
  Subjective:     Patient ID: Terri Flores, female   DOB: March 23, 1970, 52 y.o.   MRN: [id number]  HPI Terri Flores is a 52 year old white female, married, G0P0, back in follow up on starting duavee and hot flashes are better, none during the day not and may get warm at night but not sweating, did have PMS and period in June, PMP was November 2023. And brain fog is better.      Component Value Date/Time   DIAGPAP  01/05/2020 0914    - Negative for intraepithelial lesion or malignancy (NILM)   HPVHIGH Negative 01/05/2020 0914   ADEQPAP  01/05/2020 0914    Satisfactory for evaluation; transformation zone component ABSENT.    PCP is Dr Allena Katz. Review of Systems Hot flashes are better Had PMS and period in June, PMP was November 2023 Brain Reviewed past medical,surgical, social and family history. Reviewed medications and allergies. fog is better    Objective:   Physical Exam BP 122/86 (BP Location: Left Arm, Patient Position: Sitting, Cuff Size: Normal)   Pulse 77   Ht 5\' 11"  (1.803 m)   Wt 211 lb (95.7 kg)   LMP 08/23/2022   BMI 29.43 kg/m   has lost 4 lbs, has been walking when not too hot Skin warm and dry.  Lungs: clear to ausculation bilaterally. Cardiovascular: regular rate and rhythm.    Fall risk I slow  Upstream - 09/25/22 0859       Pregnancy Intention Screening   Does the patient want to become pregnant in the next year? No    Does the patient's partner want to become pregnant in the next year? No    Would the patient like to discuss contraceptive options today? No      Contraception Wrap Up   Current Method Abstinence    End Method Abstinence             Assessment:     1. Hot flashes Better, none during day, warm at night at times  Will continue [facility]   2. Hormone replacement therapy (HRT) Meds ordered this encounter  Medications   Conj Estrogens-Bazedoxifene (DUAVEE) 0.45-20 MG TABS    Sig: Take 1 tablet by mouth daily.    Dispense:  30 tablet     Refill:  6    Order Specific Question:   Supervising Provider    Answer:   Lazaro Arms [[id number]]       Plan:    Keep walking  Return in 3 months for pap and ROS

## 2022-09-27 IMAGING — MG DIGITAL SCREENING BILAT W/ CAD
4 series · 4 of 4 positions shown · non-contrast
Comparison: None.

CLINICAL DATA: Screening.

EXAM:
DIGITAL SCREENING BILATERAL MAMMOGRAM WITH CAD

[L MLO]
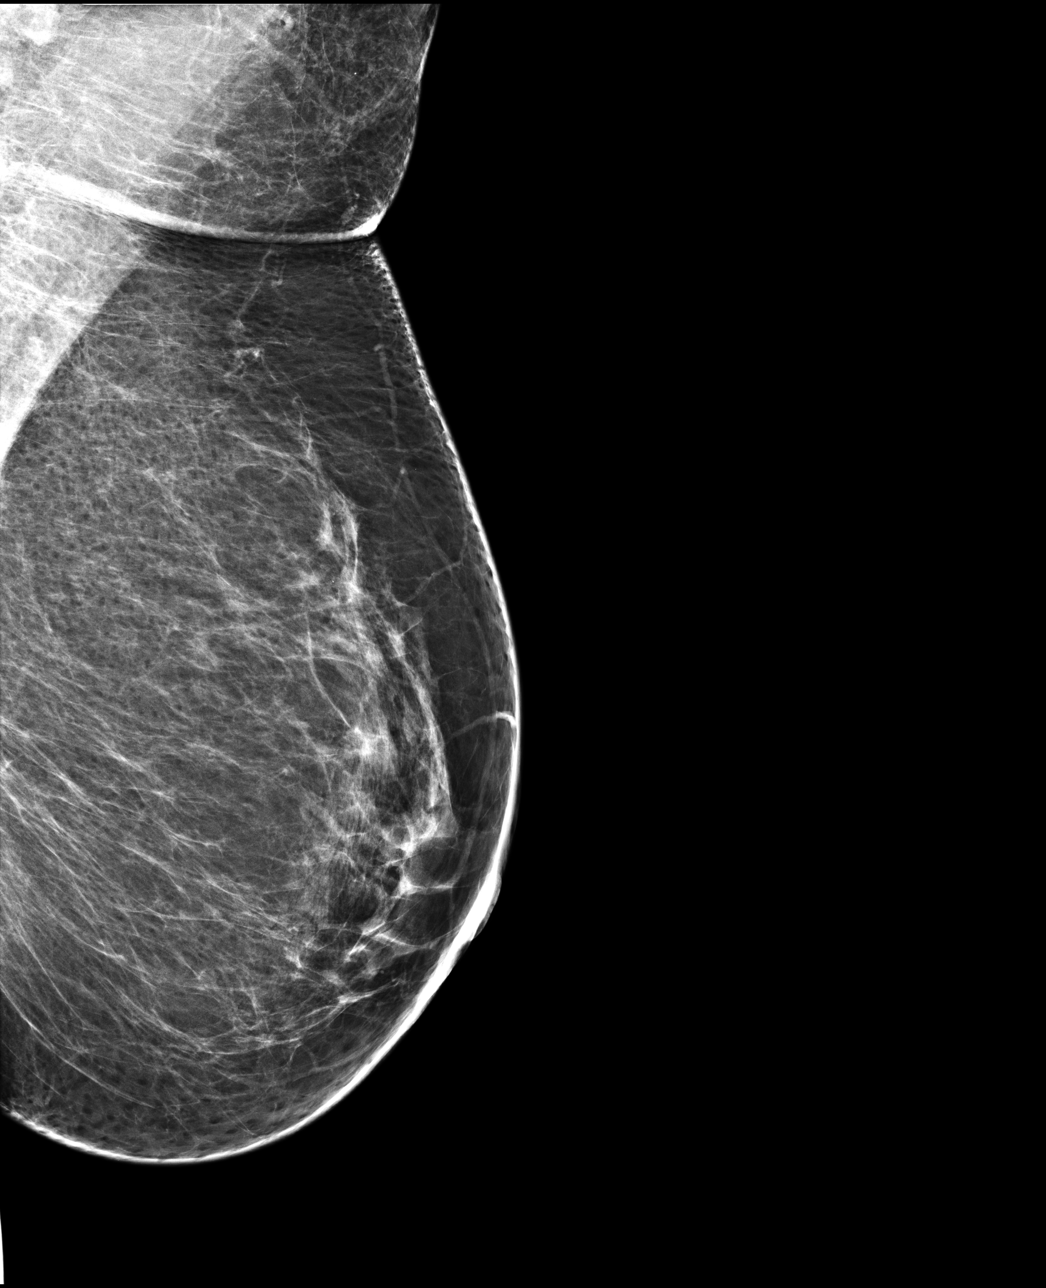

[R CC]
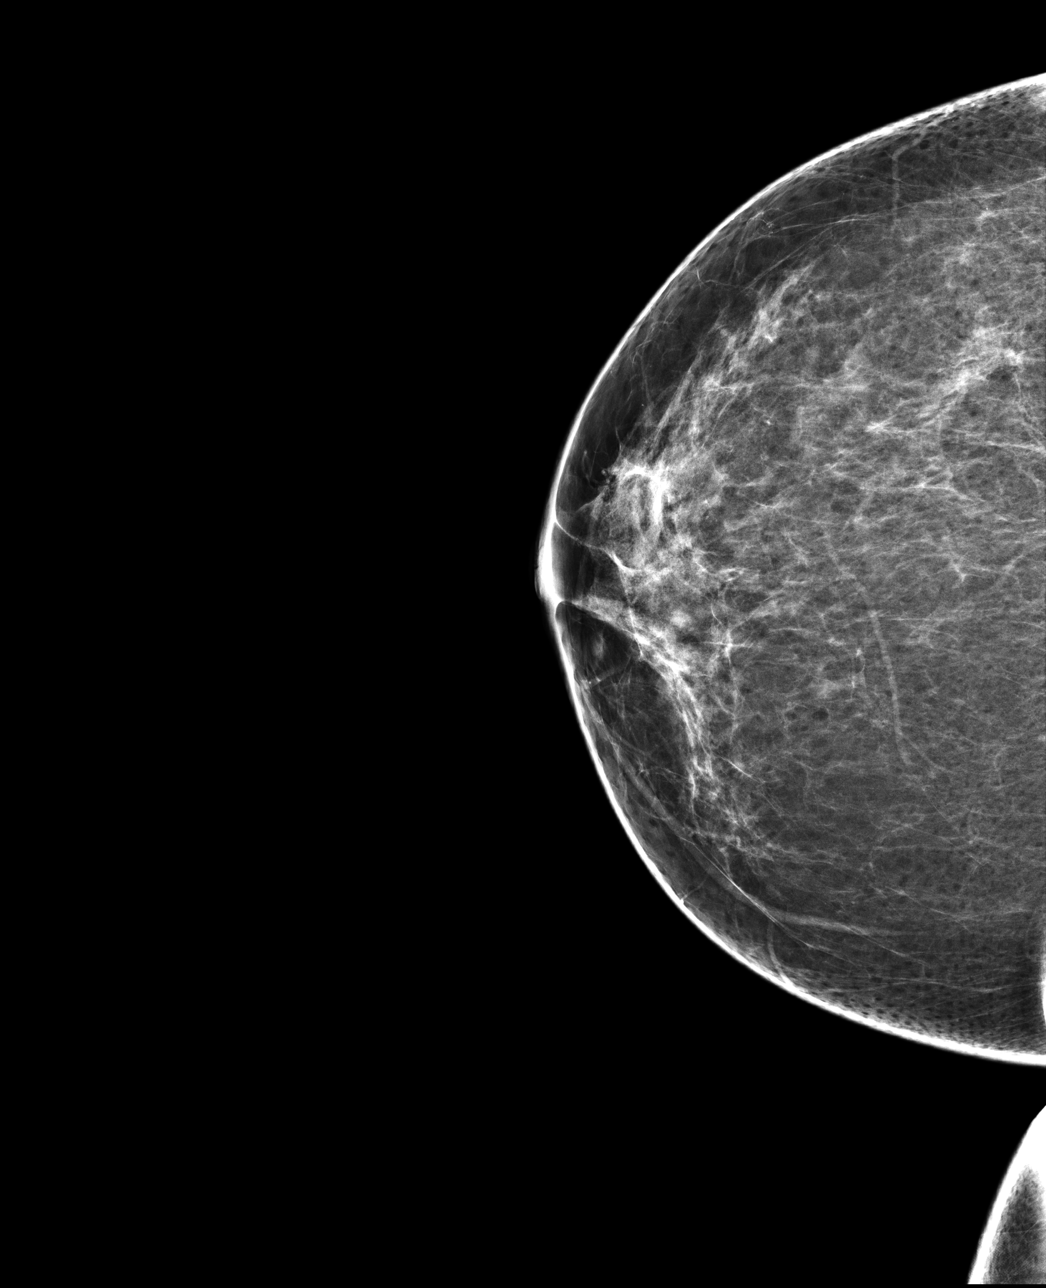

[L CC]
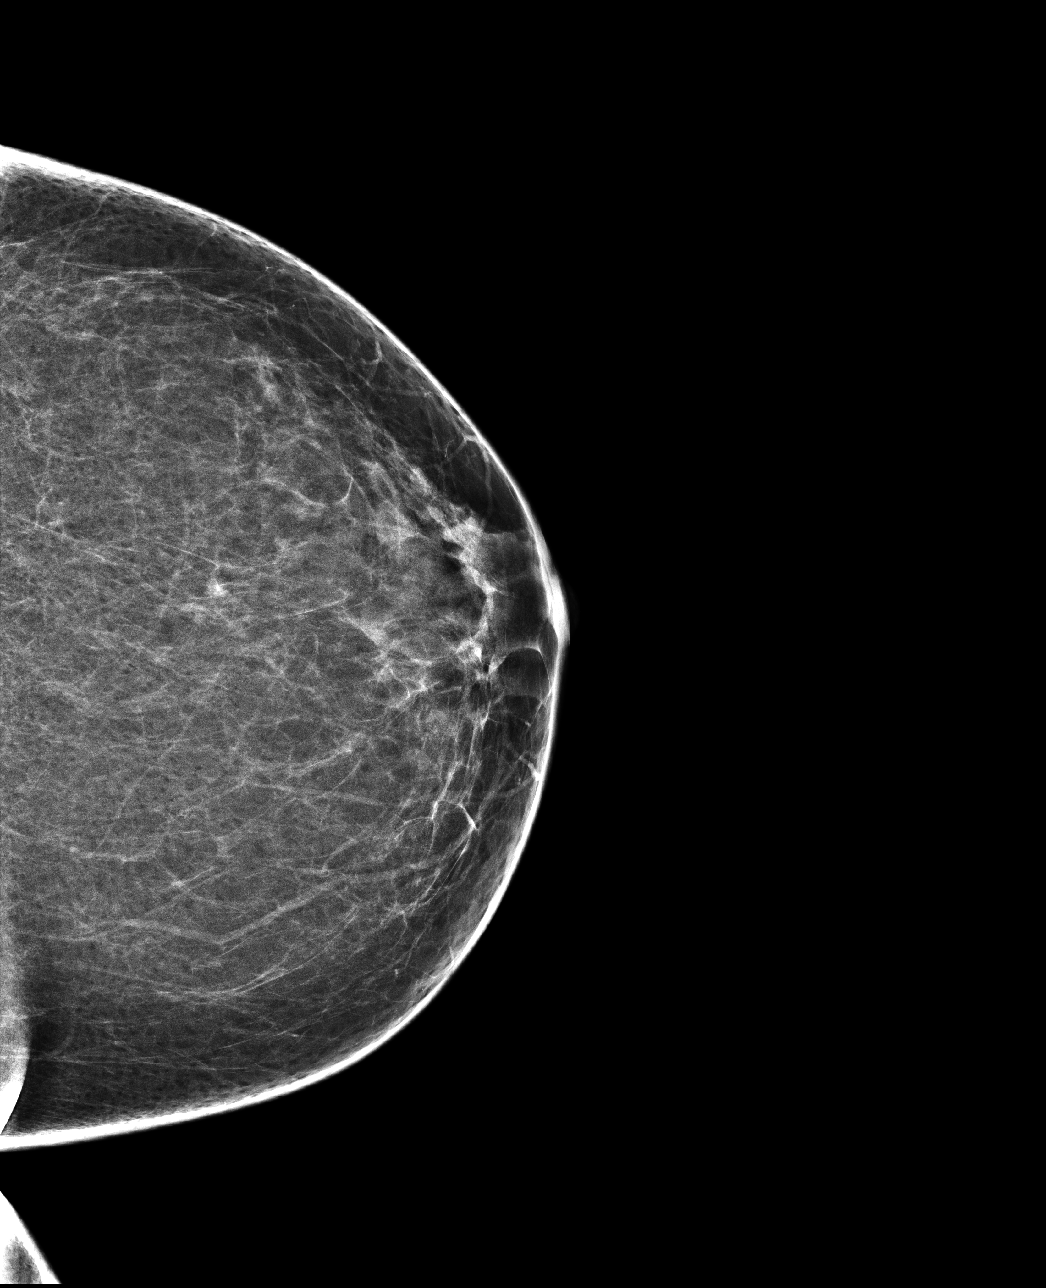

[R MLO]
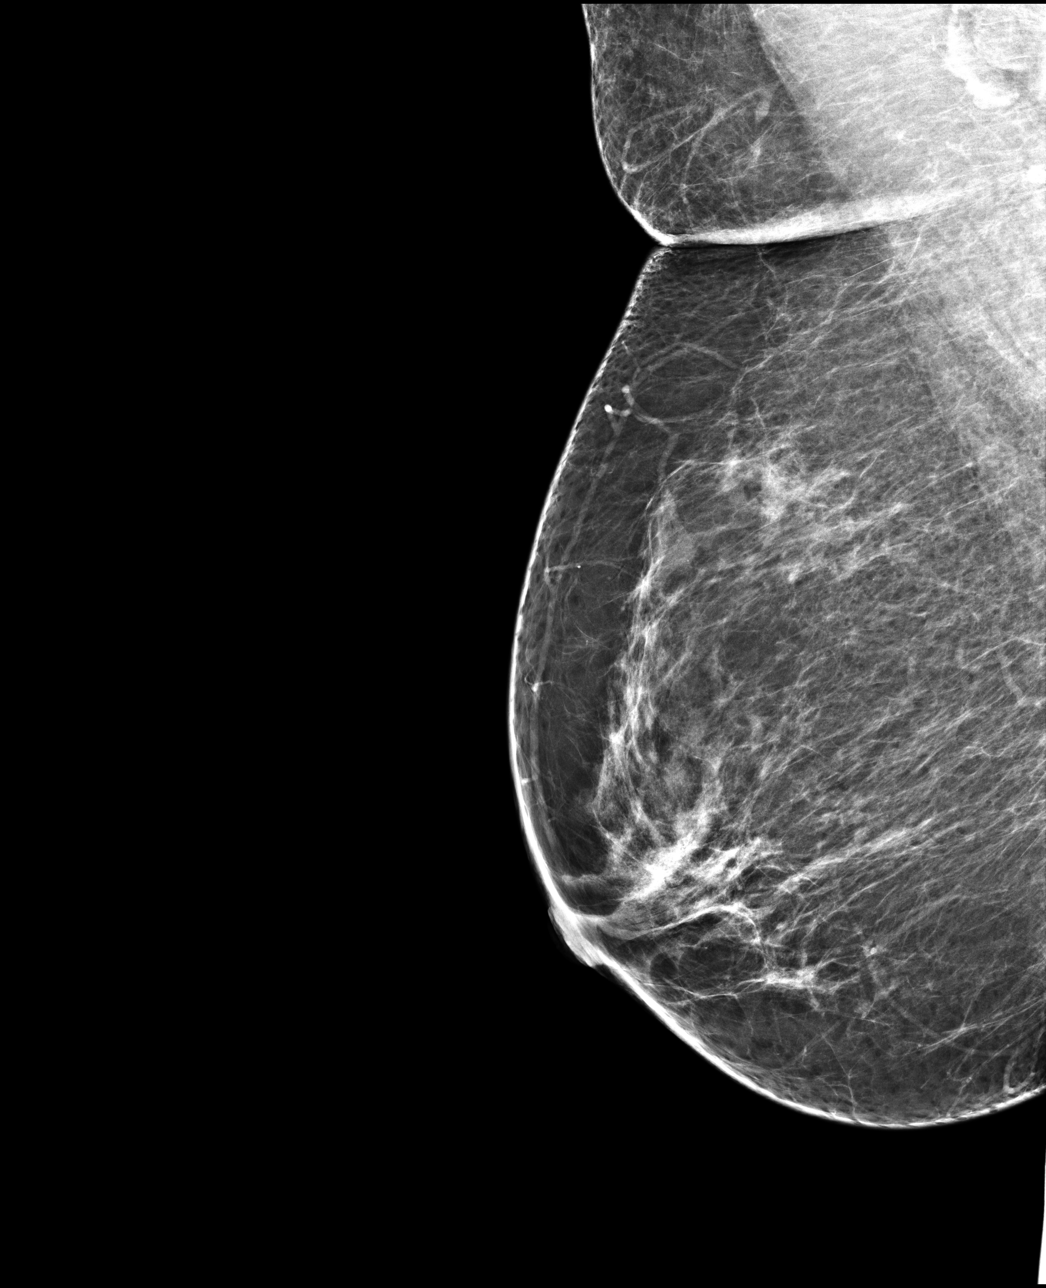

[4 of 4 positions shown; findings below may reference images not displayed]

ACR Breast Density Category b: There are scattered areas of
fibroglandular density.
FINDINGS: There are no findings suspicious for malignancy. Images were
processed with CAD.
IMPRESSION: No mammographic evidence of malignancy. A result letter of this
screening mammogram will be mailed directly to the patient.

RECOMMENDATION:
Screening mammogram in one year. (Code:[id number])

BI-RADS CATEGORY  1: Negative.

## 2022-12-08 ENCOUNTER — Other Ambulatory Visit: Payer: Self-pay | Admitting: Internal Medicine

## 2022-12-08 DIAGNOSIS — J309 Allergic rhinitis, unspecified: Secondary | ICD-10-CM

## 2022-12-26 ENCOUNTER — Ambulatory Visit: Payer: No Typology Code available for payment source | Admitting: Adult Health

## 2022-12-26 ENCOUNTER — Other Ambulatory Visit (HOSPITAL_COMMUNITY)
Admission: RE | Admit: 2022-12-26 | Discharge: 2022-12-26 | Disposition: A | Discharge status: Home / Self Care | Payer: No Typology Code available for payment source | Source: Ambulatory Visit | Attending: Adult Health | Admitting: Adult Health

## 2022-12-26 ENCOUNTER — Encounter: Payer: Self-pay | Admitting: Adult Health

## 2022-12-26 VITALS — BP 116/79 | HR 82 | Ht 70.0 in | Wt 208.0 lb

## 2022-12-26 DIAGNOSIS — Z1211 Encounter for screening for malignant neoplasm of colon: Secondary | ICD-10-CM | POA: Diagnosis not present

## 2022-12-26 DIAGNOSIS — R232 Flushing: Secondary | ICD-10-CM

## 2022-12-26 DIAGNOSIS — Z79890 Hormone replacement therapy (postmenopausal): Secondary | ICD-10-CM

## 2022-12-26 DIAGNOSIS — Z01419 Encounter for gynecological examination (general) (routine) without abnormal findings: Secondary | ICD-10-CM | POA: Diagnosis present

## 2022-12-26 DIAGNOSIS — Z1331 Encounter for screening for depression: Secondary | ICD-10-CM | POA: Diagnosis not present

## 2022-12-26 DIAGNOSIS — R4189 Other symptoms and signs involving cognitive functions and awareness: Secondary | ICD-10-CM

## 2022-12-26 DIAGNOSIS — Z780 Asymptomatic menopausal state: Secondary | ICD-10-CM

## 2022-12-26 DIAGNOSIS — G479 Sleep disorder, unspecified: Secondary | ICD-10-CM

## 2022-12-26 DIAGNOSIS — N926 Irregular menstruation, unspecified: Secondary | ICD-10-CM

## 2022-12-26 LAB — HEMOCCULT GUIAC POC 1CARD (OFFICE): Fecal Occult Blood, POC: NEGATIVE

## 2022-12-26 NOTE — Progress Notes (Signed)
Patient ID: Terri Flores, female   DOB: August 31, 1970, 52 y.o.   MRN: [id number] History of Present Illness: Terri Flores is a 52 year old white female, married, G0P0, in for a well woman gyn exam and pap. She felt faint after shower today and had episode Tuesday while in the shower. She is skipping periods, and hot flashes are better, brain fog much better on Duavee. She does feel tired. She fasts 16-20 hours. She says breasts feel fuller too.  PCP is Dr Allena Katz.   Current Medications, Allergies, Past Medical History, Past Surgical History, Family History and Social History were reviewed in [employer] record.     Review of Systems: Patient denies any headaches, hearing loss,  blurred vision, shortness of breath, chest pain, abdominal pain, problems with bowel movements, urination, or intercourse. No joint pain or mood swings.  See HPI for positives and pertinents.   Physical Exam:BP 116/79 (BP Location: Left Arm, Patient Position: Sitting, Cuff Size: Normal)   Pulse 82   Ht 5\' 10"  (1.778 m)   Wt 208 lb (94.3 kg)   LMP 09/11/2022 (Approximate) Comment: skipping periods  BMI 29.84 kg/m   General:  Well developed, well nourished, no acute distress Skin:  Warm and dry Neck:  Midline trachea, normal thyroid, good ROM, no lymphadenopathy Lungs; Clear to auscultation bilaterally Breast:  No dominant palpable mass, retraction, or nipple discharge Cardiovascular: Regular rate and rhythm Abdomen:  Soft, non tender, no hepatosplenomegaly Pelvic:  External genitalia is normal in appearance, no lesions.  The vagina is normal in appearance. Urethra has no lesions or masses. The cervix is smooth, pap with HR HPV genotyping performed.  Uterus is felt to be normal size, shape, and contour.  No adnexal masses or tenderness noted.Bladder is non tender, no masses felt. Rectal: Good sphincter tone, no polyps, or hemorrhoids felt.  Hemoccult negative. Extremities/musculoskeletal:  No swelling  or varicosities noted, no clubbing or cyanosis Psych:  No mood changes, alert and cooperative,seems happy AA is 0 Fall risk is low    12/26/2022    8:39 AM 07/26/2022    9:26 AM 06/10/2022    1:37 PM  Depression screen PHQ 2/9  Decreased Interest 1 0 0  Down, Depressed, Hopeless 1 0 0  PHQ - 2 Score 2 0 0  Altered sleeping 3    Tired, decreased energy 1    Change in appetite 0    Feeling bad or failure about yourself  0    Trouble concentrating 1    Moving slowly or fidgety/restless 0    Suicidal thoughts 0    PHQ-9 Score 7         12/26/2022    8:40 AM 01/05/2020    9:15 AM  GAD 7 : Generalized Anxiety Score  Nervous, Anxious, on Edge 2 1  Control/stop worrying 1 0  Worry too much - different things 1 0  Trouble relaxing 1 0  Restless 1 0  Easily annoyed or irritable 1 0  Afraid - awful might happen 0 0  Total GAD 7 Score 7 1      Upstream - 12/26/22 0839       Pregnancy Intention Screening   Does the patient want to become pregnant in the next year? N/A    Does the patient's partner want to become pregnant in the next year? N/A    Would the patient like to discuss contraceptive options today? N/A      Contraception Wrap Up  Current Method Abstinence    End Method Abstinence    Contraception Counseling Provided No            Examination chaperoned by Malachy Mood LPN  Impression and Plan: 1. Encounter for gynecological examination with Papanicolaou smear of cervix Pap sent Pap in 3 years if negative Physical in 1 year Get labs before visit with PCP Mammogram was negative 07/26/22  - Cytology - PAP( [facility]ll)  2. Encounter for screening fecal occult blood testing Hemoccult was negative  - POCT occult blood stool  3. Hormone replacement therapy (HRT) On Duavee, has refills Will follow up in 3 months for ROS   4. Hot flashes Hot flashes better on Duavee   5. Sleep disturbance Sleeping better But feels tired, feel asleep on toilet   6.  Missed periods Skipping periods  7. Brain fog Much better on Duavee  8. Menopause

## 2022-12-30 LAB — CYTOLOGY - PAP
Adequacy: ABSENT
Comment: NEGATIVE
Diagnosis: NEGATIVE
High risk HPV: NEGATIVE

## 2023-01-10 LAB — TSH+FREE T4
Free T4: 1.05 ng/dL (ref 0.82–1.77)
TSH: 5.7 u[IU]/mL — ABNORMAL HIGH (ref 0.450–4.500)

## 2023-01-10 LAB — LIPID PANEL
Chol/HDL Ratio: 3.8 {ratio} (ref 0.0–4.4)
Cholesterol, Total: 287 mg/dL — ABNORMAL HIGH (ref 100–199)
HDL: 76 mg/dL (ref >39)
LDL Chol Calc (NIH): 194 mg/dL — ABNORMAL HIGH (ref 0–99)
Triglycerides: 103 mg/dL (ref 0–149)
VLDL Cholesterol Cal: 17 mg/dL (ref 5–40)

## 2023-01-27 ENCOUNTER — Ambulatory Visit (INDEPENDENT_AMBULATORY_CARE_PROVIDER_SITE_OTHER): Payer: No Typology Code available for payment source | Admitting: Internal Medicine

## 2023-01-27 ENCOUNTER — Encounter: Payer: Self-pay | Admitting: Internal Medicine

## 2023-01-27 VITALS — BP 122/84 | HR 73 | Ht 71.0 in | Wt 208.4 lb

## 2023-01-27 DIAGNOSIS — Z1211 Encounter for screening for malignant neoplasm of colon: Secondary | ICD-10-CM | POA: Diagnosis not present

## 2023-01-27 DIAGNOSIS — Z23 Encounter for immunization: Secondary | ICD-10-CM

## 2023-01-27 DIAGNOSIS — R42 Dizziness and giddiness: Secondary | ICD-10-CM | POA: Insufficient documentation

## 2023-01-27 DIAGNOSIS — R55 Syncope and collapse: Secondary | ICD-10-CM

## 2023-01-27 DIAGNOSIS — E782 Mixed hyperlipidemia: Secondary | ICD-10-CM

## 2023-01-27 DIAGNOSIS — R232 Flushing: Secondary | ICD-10-CM

## 2023-01-27 DIAGNOSIS — E039 Hypothyroidism, unspecified: Secondary | ICD-10-CM | POA: Diagnosis not present

## 2023-01-27 MED ORDER — LEVOTHYROXINE SODIUM 25 MCG PO TABS
25.0000 ug | ORAL_TABLET | Freq: Every day | ORAL | 0 refills | Status: DC
Start: 2023-01-27 — End: 2023-03-05

## 2023-01-27 NOTE — Assessment & Plan Note (Signed)
Checked lipid profile - LDL > 190, but she admits that she has been eating outside mostly and needs to improve diet Cholesterol had improved with diet modification in the past, she prefers to current continue low-cholesterol diet for now

## 2023-01-27 NOTE — Assessment & Plan Note (Signed)
Improved with Duavee Followed by OB/GYN

## 2023-01-27 NOTE — Assessment & Plan Note (Addendum)
Lab Results  Component Value Date   TSH 5.700 (H) 01/09/2023   Has chronic fatigue Started levothyroxine 25 mcg QD Check TSH and free T4 after 3 months

## 2023-01-27 NOTE — Assessment & Plan Note (Signed)
BP WNL, no tachycardia EKG: Sinus rhythm.  No signs of active ischemia.  Her postural dizziness/presyncope is likely due to prolonged fasting, advised to eat at regular intervals to avoid dehydration and/or electrolyte deficiencies If persistent spells, will need cardiology evaluation

## 2023-01-27 NOTE — Progress Notes (Signed)
Established Patient Office Visit  Subjective:  Patient ID: Terri Flores, female    DOB: 10/14/1970  Age: 52 y.o. MRN: [id number]  CC:  Chief Complaint  Patient presents with   Loss of Consciousness    Patient has experienced fainting episodes in the last six week    Menopause    Six month follow up    Fatigue    Super exhausted and fatigued    HPI Terri Flores is a 52 y.o. female with past medical history of allergic sinusitis and chronic fatigue who presents for f/u of her chronic medical conditions.  Presyncope: She has had multiple episodes of almost passing out in the last 6 weeks.  She has had to sit down to avoid lightheadedness.  She feels extremely fatigued throughout the day.  She does  16 to 18 hours intermittent fasting currently - from 6 PM to 12 or 1 PM.  Most of her presyncopal episodes have happened in the morning.  Denies any episode of fall or LOC.  Denies seizure-like activity.  Denies any chest pain, dyspnea or palpitations currently.  Hypothyroidism: Her TSH was elevated again, with normal free T4.  Does not report any change in appetite.  Has chronic fatigue.  Had hair loss, has been taking hair, skin and nail multivitamin, but agrees to stop it to avoid interaction with levothyroxine for now.  She has started [facility] for menopausal symptoms, has noticed mild improvement.  Denies any vaginal bleeding or discharge.  Past Medical History:  Diagnosis Date   Hyperlipidemia     Past Surgical History:  Procedure Laterality Date   APPENDECTOMY     CHOLECYSTECTOMY     CYST REMOVAL HAND Left    OVARIAN CYST REMOVAL Bilateral     Family History  Problem Relation Age of Onset   Obesity Mother    Diabetes Mother    Hyperlipidemia Mother    Other Father        brain tumor   Obesity Sister    Cancer Brother    Hyperlipidemia Brother    Obesity Brother    Heart attack Paternal Grandfather    Heart attack Maternal Grandmother    Other Maternal  Grandfather        severe joint issues   Other Paternal Aunt        brain tumor   Other Paternal Aunt        esophagus "exploded"    Social History   Socioeconomic History   Marital status: Married    Spouse name: Not on file   Number of children: Not on file   Years of education: Not on file   Highest education level: GED or equivalent  Occupational History   Not on file  Tobacco Use   Smoking status: Former    Types: Cigarettes   Smokeless tobacco: Never  Vaping Use   Vaping status: Never Used  Substance and Sexual Activity   Alcohol use: Not Currently    Comment: "once in a blue moon"   Drug use: Never   Sexual activity: Not Currently    Birth control/protection: Abstinence  Other Topics Concern   Not on file  Social History Narrative   Not on file   Social Determinants of Health   Financial Resource Strain: Low Risk  (01/26/2023)   Overall Financial Resource Strain (CARDIA)    Difficulty of Paying Living Expenses: Not hard at all  Food Insecurity: No Food Insecurity (01/26/2023)   Hunger Vital Sign  Worried About Programme researcher, broadcasting/film/video in the Last Year: Never true    Ran Out of Food in the Last Year: Never true  Transportation Needs: No Transportation Needs (01/26/2023)   PRAPARE - Administrator, Civil Service (Medical): No    Lack of Transportation (Non-Medical): No  Physical Activity: Insufficiently Active (01/26/2023)   Exercise Vital Sign    Days of Exercise per Week: 3 days    Minutes of Exercise per Session: 30 min  Stress: No Stress Concern Present (01/26/2023)   [employer] of Occupational Health - Occupational Stress Questionnaire    Feeling of Stress : Not at all  Recent Concern: Stress - Stress Concern Present (12/26/2022)   [employer] of Occupational Health - Occupational Stress Questionnaire    Feeling of Stress : To some extent  Social Connections: Socially Isolated (01/26/2023)   Social Connection and Isolation  Panel [NHANES]    Frequency of Communication with Friends and Family: Once a week    Frequency of Social Gatherings with Friends and Family: Never    Attends Religious Services: Never    Database administrator or Organizations: No    Attends Banker Meetings: Never    Marital Status: Married  Catering manager Violence: Not At Risk (12/26/2022)   Humiliation, Afraid, Rape, and Kick questionnaire    Fear of Current or Ex-Partner: No    Emotionally Abused: No    Physically Abused: No    Sexually Abused: No    Outpatient Medications Prior to Visit  Medication Sig Dispense Refill   azelastine (ASTELIN) 0.1 % nasal spray Place 2 sprays into both nostrils 2 (two) times daily. Use in each nostril as directed 30 mL 2   Coenzyme Q10 (COQ10) 100 MG CAPS Take by mouth. Takes 190mg      COLLAGEN PO Take by mouth.     Conj Estrogens-Bazedoxifene (DUAVEE) 0.45-20 MG TABS Take 1 tablet by mouth daily. 30 tablet 6   Cyanocobalamin 5000 MCG/ML LIQD Place under the tongue. Takes 2500     ferrous sulfate 324 MG TBEC Take 324 mg by mouth.     levocetirizine (XYZAL) 5 MG tablet TAKE 1 TABLET BY MOUTH EVERY DAY IN THE EVENING 90 tablet 1   MAGNESIUM PO Take 1 capsule by mouth daily.     Multiple Vitamin (MULTIVITAMIN ADULT PO) Take 1 tablet by mouth daily.     OVER THE COUNTER MEDICATION Take 1 each by mouth daily. Hair Skin and Nails     POTASSIUM PO Take by mouth. Once or twice a week     TURMERIC PO Take 1 Dose by mouth daily.     VITAMIN D PO Take by mouth.     VITAMIN D-VITAMIN K PO Take by mouth. Takes     No facility-administered medications prior to visit.    Allergies  Allergen Reactions   Other Nausea Only    Pain medication. Patient can't recall the name    ROS Review of Systems  Constitutional:  Positive for fatigue. Negative for chills and fever.  HENT:  Positive for congestion. Negative for sinus pressure and sore throat.   Eyes:  Negative for pain and discharge.   Respiratory:  Negative for cough and shortness of breath.   Cardiovascular:  Negative for chest pain and palpitations.  Gastrointestinal:  Negative for abdominal pain, diarrhea, nausea and vomiting.  Endocrine: Negative for polydipsia and polyuria.       Hot flashes  Genitourinary:  Negative for dysuria and hematuria.  Musculoskeletal:  Positive for arthralgias (B/l hip pain). Negative for neck pain and neck stiffness.  Skin:  Negative for rash.  Neurological:  Negative for dizziness and weakness.  Psychiatric/Behavioral:  Negative for agitation and behavioral problems. The patient is nervous/anxious.       Objective:    Physical Exam Vitals reviewed.  Constitutional:      General: She is not in acute distress.    Appearance: She is not diaphoretic.  HENT:     Head: Normocephalic and atraumatic.     Nose: No congestion.     Mouth/Throat:     Mouth: Mucous membranes are moist.     Pharynx: No posterior oropharyngeal erythema.  Eyes:     General: No scleral icterus.    Extraocular Movements: Extraocular movements intact.  Neck:     Thyroid: No thyromegaly.  Cardiovascular:     Rate and Rhythm: Normal rate and regular rhythm.     Pulses: Normal pulses.     Heart sounds: Normal heart sounds. No murmur heard. Pulmonary:     Breath sounds: Normal breath sounds. No wheezing or rales.  Musculoskeletal:     Cervical back: Neck supple. No tenderness.     Right lower leg: No edema.     Left lower leg: No edema.  Skin:    General: Skin is warm.     Findings: No erythema.  Neurological:     General: No focal deficit present.     Mental Status: She is alert and oriented to person, place, and time.     Sensory: No sensory deficit.     Motor: No weakness.  Psychiatric:        Mood and Affect: Mood normal.        Behavior: Behavior normal.     BP 122/84 (BP Location: Left Arm, Patient Position: Sitting, Cuff Size: Large)   Pulse 73   Ht 5\' 11"  (1.803 m)   Wt 208 lb 6.4 oz  (94.5 kg)   SpO2 96%   BMI 29.07 kg/m  Wt Readings from Last 3 Encounters:  01/27/23 208 lb 6.4 oz (94.5 kg)  12/26/22 208 lb (94.3 kg)  09/25/22 211 lb (95.7 kg)    Lab Results  Component Value Date   TSH 5.700 (H) 01/09/2023   Lab Results  Component Value Date   WBC 6.4 06/10/2022   HGB 14.0 06/10/2022   HCT 41.9 06/10/2022   MCV 93 06/10/2022   PLT 179 06/10/2022   Lab Results  Component Value Date   NA 136 06/10/2022   K 4.4 06/10/2022   CO2 22 06/10/2022   GLUCOSE 91 06/10/2022   BUN 9 06/10/2022   CREATININE 0.77 06/10/2022   BILITOT 0.4 06/10/2022   ALKPHOS 61 06/10/2022   AST 29 06/10/2022   ALT 38 (H) 06/10/2022   PROT 7.5 06/10/2022   ALBUMIN 4.7 06/10/2022   CALCIUM 9.7 06/10/2022   EGFR 93 06/10/2022   Lab Results  Component Value Date   CHOL 287 (H) 01/09/2023   Lab Results  Component Value Date   HDL 76 01/09/2023   Lab Results  Component Value Date   LDLCALC 194 (H) 01/09/2023   Lab Results  Component Value Date   TRIG 103 01/09/2023   Lab Results  Component Value Date   CHOLHDL 3.8 01/09/2023   Lab Results  Component Value Date   HGBA1C 5.8 (H) 06/10/2022      Assessment & Plan:  Problem List Items Addressed This Visit       Cardiovascular and Mediastinum   Hot flashes    Improved with Duavee Followed by OB/GYN      Postural dizziness with presyncope    BP WNL, no tachycardia EKG: Sinus rhythm.  No signs of active ischemia.  Her postural dizziness/presyncope is likely due to prolonged fasting, advised to eat at regular intervals to avoid dehydration and/or electrolyte deficiencies If persistent spells, will need cardiology evaluation      Relevant Orders   EKG 12-Lead (Completed)     Endocrine   Hypothyroidism - Primary    Lab Results  Component Value Date   TSH 5.700 (H) 01/09/2023   Has chronic fatigue Started levothyroxine 25 mcg QD Check TSH and free T4 after 3 months      Relevant Medications    levothyroxine (SYNTHROID) 25 MCG tablet   Other Relevant Orders   TSH+T4F+T3Free   Thyroid peroxidase antibody   CMP14+EGFR   Thyroglobulin antibody     Other   Mixed hyperlipidemia    Checked lipid profile - LDL > 190, but she admits that she has been eating outside mostly and needs to improve diet Cholesterol had improved with diet modification in the past, she prefers to current continue low-cholesterol diet for now      Relevant Orders   Lipid Profile   Other Visit Diagnoses     Colon cancer screening       Relevant Orders   Cologuard   Encounter for immunization       Relevant Orders   Flu vaccine trivalent PF, 6mos and older(Flulaval,Afluria,Fluarix,Fluzone) (Completed)       Meds ordered this encounter  Medications   levothyroxine (SYNTHROID) 25 MCG tablet    Sig: Take 1 tablet (25 mcg total) by mouth daily.    Dispense:  90 tablet    Refill:  0    Follow-up: Return in about 3 months (around 04/29/2023) for Hypothyroidism.    Anabel Halon, MD

## 2023-01-27 NOTE — Patient Instructions (Signed)
Please start taking Levothyroxine 25 mcg once daily on empty stomach. Please avoid any vitamin supplements at least 4 hours after the dose.  Please decrease duration of intermittent fasting to 12 hours to avoid presyncopal spells. Maintain at least 64 ounces of fluid intake in a day.  Please continue to follow low cholesterol diet and perform moderate exercise/walking at least 150 mins/week.  Please get fasting blood tests done before the next visit.

## 2023-02-14 LAB — COLOGUARD: COLOGUARD: NEGATIVE

## 2023-03-05 ENCOUNTER — Other Ambulatory Visit: Payer: Self-pay

## 2023-03-05 ENCOUNTER — Encounter: Payer: Self-pay | Admitting: Internal Medicine

## 2023-03-05 ENCOUNTER — Other Ambulatory Visit: Payer: Self-pay | Admitting: Internal Medicine

## 2023-03-05 DIAGNOSIS — J309 Allergic rhinitis, unspecified: Secondary | ICD-10-CM

## 2023-03-05 DIAGNOSIS — E039 Hypothyroidism, unspecified: Secondary | ICD-10-CM

## 2023-03-05 MED ORDER — LEVOCETIRIZINE DIHYDROCHLORIDE 5 MG PO TABS
5.0000 mg | ORAL_TABLET | Freq: Every evening | ORAL | 1 refills | Status: DC
Start: 2023-03-05 — End: 2023-12-01

## 2023-03-28 ENCOUNTER — Encounter: Payer: Self-pay | Admitting: Adult Health

## 2023-03-28 ENCOUNTER — Ambulatory Visit (INDEPENDENT_AMBULATORY_CARE_PROVIDER_SITE_OTHER): Payer: No Typology Code available for payment source | Admitting: Adult Health

## 2023-03-28 VITALS — BP 128/85 | HR 76 | Ht 71.0 in | Wt 208.0 lb

## 2023-03-28 DIAGNOSIS — R4189 Other symptoms and signs involving cognitive functions and awareness: Secondary | ICD-10-CM

## 2023-03-28 DIAGNOSIS — G479 Sleep disorder, unspecified: Secondary | ICD-10-CM

## 2023-03-28 DIAGNOSIS — Z79890 Hormone replacement therapy (postmenopausal): Secondary | ICD-10-CM | POA: Diagnosis not present

## 2023-03-28 DIAGNOSIS — R232 Flushing: Secondary | ICD-10-CM

## 2023-03-28 MED ORDER — DUAVEE 0.45-20 MG PO TABS: 1.0000 | ORAL_TABLET | Freq: Every day | ORAL | 6 refills | Status: DC

## 2023-03-28 NOTE — Progress Notes (Signed)
  Subjective:     Patient ID: Terri Flores, female   DOB: 05-16-1970, 53 y.o.   MRN: [id number]  HPI [name] is a 53 year old white female, married, G0P0, back in follow up on taking Duavee . Hot flashes are better, no night sweats, brain fog is better, but feels fatigued. Sleeping good. She is on synthroid  25 mcg 1 daily, and had handout from pharmacy that duavee  could affect. Has labs soon to check thyroid , will continue Duavee .      Component Value Date/Time   DIAGPAP  12/26/2022 0844    - Negative for intraepithelial lesion or malignancy (NILM)   DIAGPAP  01/05/2020 0914    - Negative for intraepithelial lesion or malignancy (NILM)   HPVHIGH Negative 12/26/2022 0844   HPVHIGH Negative 01/05/2020 0914   ADEQPAP  12/26/2022 0844    Satisfactory for evaluation; transformation zone component ABSENT.   ADEQPAP  01/05/2020 0914    Satisfactory for evaluation; transformation zone component ABSENT.    PCP is Dr Tobie   Review of Systems Hot flashes are better,  no night sweats,  brain fog is better,  + fatigued.    Sleeping good Reviewed past medical,surgical, social and family history. Reviewed medications and allergies.  Objective:   Physical Exam BP 128/85 (BP Location: Left Arm, Patient Position: Sitting, Cuff Size: Normal)   Pulse 76   Ht 5' 11 (1.803 m)   Wt 208 lb (94.3 kg)   BMI 29.01 kg/m     Skin warm and dry.  Lungs: clear to ausculation bilaterally. Cardiovascular: regular rate and rhythm.   Upstream - 03/28/23 0900       Pregnancy Intention Screening   Does the patient want to become pregnant in the next year? N/A    Does the patient's partner want to become pregnant in the next year? N/A    Would the patient like to discuss contraceptive options today? N/A      Contraception Wrap Up   Current Method Abstinence    End Method Abstinence    Contraception Counseling Provided No             Assessment:     1. Hormone replacement therapy (HRT)  (Primary) Will refill Duavee  Meds ordered this encounter  Medications   Conj Estrogens-Bazedoxifene (DUAVEE ) 0.45-20 MG TABS    Sig: Take 1 tablet by mouth daily.    Dispense:  30 tablet    Refill:  6    Supervising Provider:   JAYNE MINDER H [[id number]]     2. Hot flashes Better, no night sweast  3. Brain fog Better   4. Sleep disturbance Sleeping good    Plan:     Follow up in 6 months or sooner if needed

## 2023-04-01 ENCOUNTER — Other Ambulatory Visit: Payer: Self-pay | Admitting: Internal Medicine

## 2023-04-01 DIAGNOSIS — J309 Allergic rhinitis, unspecified: Secondary | ICD-10-CM

## 2023-04-25 ENCOUNTER — Other Ambulatory Visit: Payer: Self-pay | Admitting: Internal Medicine

## 2023-04-25 DIAGNOSIS — J309 Allergic rhinitis, unspecified: Secondary | ICD-10-CM

## 2023-04-27 LAB — THYROID PEROXIDASE ANTIBODY: Thyroperoxidase Ab SerPl-aCnc: 20 [IU]/mL (ref 0–34)

## 2023-04-27 LAB — CMP14+EGFR
ALT: 16 [IU]/L (ref 0–32)
AST: 21 [IU]/L (ref 0–40)
Albumin: 4.3 g/dL (ref 3.8–4.9)
Alkaline Phosphatase: 61 [IU]/L (ref 44–121)
BUN/Creatinine Ratio: 13 (ref 9–23)
BUN: 9 mg/dL (ref 6–24)
Bilirubin Total: 0.3 mg/dL (ref 0.0–1.2)
CO2: 23 mmol/L (ref 20–29)
Calcium: 9.5 mg/dL (ref 8.7–10.2)
Chloride: 101 mmol/L (ref 96–106)
Creatinine, Ser: 0.71 mg/dL (ref 0.57–1.00)
Globulin, Total: 2.8 g/dL (ref 1.5–4.5)
Glucose: 93 mg/dL (ref 70–99)
Potassium: 4.3 mmol/L (ref 3.5–5.2)
Sodium: 140 mmol/L (ref 134–144)
Total Protein: 7.1 g/dL (ref 6.0–8.5)
eGFR: 102 mL/min/{1.73_m2} (ref >59)

## 2023-04-27 LAB — LIPID PANEL
Chol/HDL Ratio: 3.1 {ratio} (ref 0.0–4.4)
Cholesterol, Total: 244 mg/dL — ABNORMAL HIGH (ref 100–199)
HDL: 79 mg/dL (ref >39)
LDL Chol Calc (NIH): 150 mg/dL — ABNORMAL HIGH (ref 0–99)
Triglycerides: 86 mg/dL (ref 0–149)
VLDL Cholesterol Cal: 15 mg/dL (ref 5–40)

## 2023-04-27 LAB — THYROGLOBULIN ANTIBODY

## 2023-04-27 LAB — TSH+T4F+T3FREE
Free T4: 1.16 ng/dL (ref 0.82–1.77)
T3, Free: 2.5 pg/mL (ref 2.0–4.4)
TSH: 3.85 u[IU]/mL (ref 0.450–4.500)

## 2023-04-29 ENCOUNTER — Ambulatory Visit (INDEPENDENT_AMBULATORY_CARE_PROVIDER_SITE_OTHER): Payer: No Typology Code available for payment source | Admitting: Internal Medicine

## 2023-04-29 ENCOUNTER — Encounter: Payer: Self-pay | Admitting: Internal Medicine

## 2023-04-29 VITALS — BP 120/82 | HR 70 | Ht 71.0 in | Wt 207.4 lb

## 2023-04-29 DIAGNOSIS — R232 Flushing: Secondary | ICD-10-CM

## 2023-04-29 DIAGNOSIS — R7303 Prediabetes: Secondary | ICD-10-CM | POA: Diagnosis not present

## 2023-04-29 DIAGNOSIS — Z23 Encounter for immunization: Secondary | ICD-10-CM

## 2023-04-29 DIAGNOSIS — E559 Vitamin D deficiency, unspecified: Secondary | ICD-10-CM

## 2023-04-29 DIAGNOSIS — E039 Hypothyroidism, unspecified: Secondary | ICD-10-CM | POA: Diagnosis not present

## 2023-04-29 DIAGNOSIS — L309 Dermatitis, unspecified: Secondary | ICD-10-CM

## 2023-04-29 DIAGNOSIS — J309 Allergic rhinitis, unspecified: Secondary | ICD-10-CM

## 2023-04-29 DIAGNOSIS — E782 Mixed hyperlipidemia: Secondary | ICD-10-CM | POA: Diagnosis not present

## 2023-04-29 MED ORDER — MOMETASONE FUROATE 0.1 % EX CREA
TOPICAL_CREAM | CUTANEOUS | 1 refills | Status: AC | PRN
Start: 2023-04-29 — End: 2023-07-28

## 2023-04-29 MED ORDER — LEVOTHYROXINE SODIUM 50 MCG PO TABS: 50.0000 ug | ORAL_TABLET | Freq: Every day | ORAL | 0 refills | Status: DC

## 2023-04-29 NOTE — Assessment & Plan Note (Signed)
Now improved with Xyzal and azelastine nasal spray

## 2023-04-29 NOTE — Progress Notes (Addendum)
 Established Patient Office Visit  Subjective:  Patient ID: Terri Flores, female    DOB: 1970/05/03  Age: 53 y.o. MRN: [id number]  CC:  Chief Complaint  Patient presents with  . Care Management    3 month f/u, needs refills on the eczema cream.    HPI Terri Flores is a 53 y.o. female with past medical history of allergic sinusitis and chronic fatigue who presents for f/u of her chronic medical conditions.  Presyncope: She has had multiple episodes of almost passing out before previous visit, but denies any recent episode. She does 16 to 18 hours intermittent fasting currently - from 6 PM to 12 or 1 PM.  Most of her presyncopal episodes have happened in the morning, but has been avoiding prolonged fasting after waking up - wakes up around 10 AM.  Denies any episode of fall or LOC.  Denies seizure-like activity.  Denies any chest pain, dyspnea or palpitations currently.  Hypothyroidism: She has started taking levothyroxine  25 mcg QD.  Her TSH was better - still high-normal, with normal free T4.  Does not report any change in appetite.  Has chronic fatigue, but has improved, compared to prior.  Had hair loss, has been taking hair, skin and nail multivitamin, but has stopped it now.  She had brittle nails, which have also improved now.  She has started Duavee  for menopausal symptoms, has noticed mild improvement.  Denies any vaginal bleeding or discharge.  Eczema: Usually responds well to mometasone  cream, requests refills.  Past Medical History:  Diagnosis Date  . Hyperlipidemia   . Thyroid  disease     Past Surgical History:  Procedure Laterality Date  . APPENDECTOMY    . CHOLECYSTECTOMY    . CYST REMOVAL HAND Left   . OVARIAN CYST REMOVAL Bilateral     Family History  Problem Relation Age of Onset  . Obesity Mother   . Diabetes Mother   . Hyperlipidemia Mother   . Other Father        brain tumor  . Obesity Sister   . Cancer Brother   . Hyperlipidemia Brother   .  Obesity Brother   . Heart attack Paternal Grandfather   . Heart attack Maternal Grandmother   . Other Maternal Grandfather        severe joint issues  . Other Paternal Aunt        brain tumor  . Other Paternal Aunt        esophagus exploded    Social History   Socioeconomic History  . Marital status: Married    Spouse name: Not on file  . Number of children: Not on file  . Years of education: Not on file  . Highest education level: GED or equivalent  Occupational History  . Not on file  Tobacco Use  . Smoking status: Former    Types: Cigarettes  . Smokeless tobacco: Never  Vaping Use  . Vaping status: Never Used  Substance and Sexual Activity  . Alcohol use: Not Currently    Comment: once in a blue moon  . Drug use: Never  . Sexual activity: Not Currently    Birth control/protection: Abstinence  Other Topics Concern  . Not on file  Social History Narrative  . Not on file   Social Drivers of Health   Financial Resource Strain: Low Risk  (08/27/2023)   Overall Financial Resource Strain (CARDIA)   . Difficulty of Paying Living Expenses: Not very hard  Food Insecurity: No Food  Insecurity (08/27/2023)   Hunger Vital Sign   . Worried About Programme researcher, broadcasting/film/video in the Last Year: Never true   . Ran Out of Food in the Last Year: Never true  Transportation Needs: No Transportation Needs (08/27/2023)   PRAPARE - Transportation   . Lack of Transportation (Medical): No   . Lack of Transportation (Non-Medical): No  Physical Activity: Insufficiently Active (08/27/2023)   Exercise Vital Sign   . Days of Exercise per Week: 4 days   . Minutes of Exercise per Session: 30 min  Stress: No Stress Concern Present (08/27/2023)   [employer] of Occupational Health - Occupational Stress Questionnaire   . Feeling of Stress : Only a little  Social Connections: Socially Isolated (08/27/2023)   Social Connection and Isolation Panel   . Frequency of Communication with Friends and  Family: Twice a week   . Frequency of Social Gatherings with Friends and Family: Never   . Attends Religious Services: Never   . Active Member of Clubs or Organizations: No   . Attends Banker Meetings: Never   . Marital Status: Married  Catering manager Violence: Not At Risk (12/26/2022)   Humiliation, Afraid, Rape, and Kick questionnaire   . Fear of Current or Ex-Partner: No   . Emotionally Abused: No   . Physically Abused: No   . Sexually Abused: No    Outpatient Medications Prior to Visit  Medication Sig Dispense Refill  . Coenzyme Q10 (COQ10) 100 MG CAPS Take by mouth. Takes 190mg     . COLLAGEN PO Take by mouth.    . Conj Estrogens-Bazedoxifene (DUAVEE ) 0.45-20 MG TABS Take 1 tablet by mouth daily. 30 tablet 6  . levocetirizine (XYZAL ) 5 MG tablet Take 1 tablet (5 mg total) by mouth every evening. 90 tablet 1  . MAGNESIUM PO Take 1 capsule by mouth daily.    . Multiple Vitamin (MULTIVITAMIN ADULT PO) Take 1 tablet by mouth daily.    . Omega-3 Fatty Acids (OMEGA 3 PO) Take by mouth.    SABRA POTASSIUM PO Take by mouth. Once or twice a week    . TURMERIC PO Take 1 Dose by mouth daily.    . VITAMIN D  PO Take by mouth.    . VITAMIN D -VITAMIN K PO Take by mouth. Takes 90mcg    . VITAMIN E PO Take by mouth.    . Azelastine  HCl 137 MCG/SPRAY SOLN PLACE 2 SPRAYS INTO BOTH NOSTRILS 2 (TWO) TIMES DAILY AS DIRECTED. 30 mL 1  . levothyroxine  (SYNTHROID ) 25 MCG tablet TAKE 1 TABLET BY MOUTH EVERY DAY 90 tablet 0   No facility-administered medications prior to visit.    Allergies  Allergen Reactions  . Other Nausea Only    Pain medication. Patient can't recall the name    ROS Review of Systems  Constitutional:  Positive for fatigue. Negative for chills and fever.  HENT:  Positive for congestion. Negative for sinus pressure and sore throat.   Eyes:  Negative for pain and discharge.  Respiratory:  Negative for cough and shortness of breath.   Cardiovascular:  Negative for  chest pain and palpitations.  Gastrointestinal:  Negative for abdominal pain, diarrhea, nausea and vomiting.  Endocrine: Negative for polydipsia and polyuria.       Hot flashes  Genitourinary:  Negative for dysuria and hematuria.  Musculoskeletal:  Positive for arthralgias (B/l hip pain). Negative for neck pain and neck stiffness.  Skin:  Negative for rash.  Allergic/Immunologic: Positive for environmental  allergies.  Neurological:  Negative for dizziness and weakness.  Psychiatric/Behavioral:  Negative for agitation and behavioral problems.       Objective:    Physical Exam Vitals reviewed.  Constitutional:      General: She is not in acute distress.    Appearance: She is not diaphoretic.  HENT:     Head: Normocephalic and atraumatic.     Nose: No congestion.     Mouth/Throat:     Mouth: Mucous membranes are moist.     Pharynx: No posterior oropharyngeal erythema.   Eyes:     General: No scleral icterus.    Extraocular Movements: Extraocular movements intact.   Neck:     Thyroid : No thyromegaly.   Cardiovascular:     Rate and Rhythm: Normal rate and regular rhythm.     Pulses: Normal pulses.     Heart sounds: Normal heart sounds. No murmur heard. Pulmonary:     Breath sounds: Normal breath sounds. No wheezing or rales.   Musculoskeletal:     Cervical back: Neck supple. No tenderness.     Right lower leg: No edema.     Left lower leg: No edema.   Skin:    General: Skin is warm.     Findings: No erythema.   Neurological:     General: No focal deficit present.     Mental Status: She is alert and oriented to person, place, and time.     Sensory: No sensory deficit.     Motor: No weakness.   Psychiatric:        Mood and Affect: Mood normal.        Behavior: Behavior normal.    BP 120/82   Pulse 70   Ht 5' 11 (1.803 m)   Wt 207 lb 6.4 oz (94.1 kg)   SpO2 96%   BMI 28.93 kg/m  Wt Readings from Last 3 Encounters:  04/29/23 207 lb 6.4 oz (94.1 kg)   03/28/23 208 lb (94.3 kg)  01/27/23 208 lb 6.4 oz (94.5 kg)    Lab Results  Component Value Date   TSH 0.463 08/25/2023   Lab Results  Component Value Date   WBC 4.6 08/25/2023   HGB 13.9 08/25/2023   HCT 42.7 08/25/2023   MCV 96 08/25/2023   PLT 165 08/25/2023   Lab Results  Component Value Date   NA 141 08/25/2023   K 4.3 08/25/2023   CO2 23 08/25/2023   GLUCOSE 91 08/25/2023   BUN 11 08/25/2023   CREATININE 0.63 08/25/2023   BILITOT 0.3 08/25/2023   ALKPHOS 66 08/25/2023   AST 19 08/25/2023   ALT 15 08/25/2023   PROT 7.0 08/25/2023   ALBUMIN 4.3 08/25/2023   CALCIUM 9.7 08/25/2023   EGFR 107 08/25/2023   Lab Results  Component Value Date   CHOL 239 (H) 08/25/2023   Lab Results  Component Value Date   HDL 81 08/25/2023   Lab Results  Component Value Date   LDLCALC 142 (H) 08/25/2023   Lab Results  Component Value Date   TRIG 95 08/25/2023   Lab Results  Component Value Date   CHOLHDL 3.0 08/25/2023   Lab Results  Component Value Date   HGBA1C 5.3 08/25/2023      Assessment & Plan:   Problem List Items Addressed This Visit       Cardiovascular and Mediastinum   Hot flashes   Improved with Duavee  Followed by OB/GYN  Respiratory   Allergic sinusitis   Now improved with Xyzal  and azelastine  nasal spray        Endocrine   Hypothyroidism - Primary   Lab Results  Component Value Date   TSH 3.850 04/25/2023   Had chronic fatigue, hair loss and brittle nails On levothyroxine  25 mcg QD Considering persistent fatigue with high normal TSH, increased dose of levothyroxine  to 50 mcg QD Check TSH and free T4 after 3-4 months      Relevant Medications   levothyroxine  (SYNTHROID ) 50 MCG tablet   Other Relevant Orders   TSH+T4F+T3Free (Completed)   CBC with Differential/Platelet (Completed)     Musculoskeletal and Integument   Eczema   Well-controlled with Mometasone  cream      Relevant Orders   CBC with  Differential/Platelet (Completed)     Other   Mixed hyperlipidemia   Checked lipid profile - LDL 150, improved from prior Cholesterol had improved with diet modification in the past, she prefers to current continue low-cholesterol diet for now      Relevant Orders   Lipid Profile (Completed)   Prediabetes   Lab Results  Component Value Date   HGBA1C 5.8 (H) 06/10/2022   Advised to follow low-carb diet      Relevant Orders   CMP14+EGFR (Completed)   Hemoglobin A1c (Completed)   Vitamin D  deficiency   Takes vitamin D  2000 IU daily Check Vit D level      Relevant Orders   Vitamin D  (25 hydroxy) (Completed)   Other Visit Diagnoses       Encounter for immunization       Relevant Orders   Pneumococcal conjugate vaccine 20-valent (Completed)       Meds ordered this encounter  Medications  . mometasone  (ELOCON ) 0.1 % cream    Sig: Apply topically as needed.    Dispense:  45 g    Refill:  1  . levothyroxine  (SYNTHROID ) 50 MCG tablet    Sig: Take 1 tablet (50 mcg total) by mouth daily.    Dispense:  90 tablet    Refill:  0    Follow-up: Return in about 4 months (around 08/27/2023) for Hypothyroidism.    Suzzane MARLA Blanch, MD

## 2023-04-29 NOTE — Assessment & Plan Note (Addendum)
 Lab Results  Component Value Date   TSH 3.850 04/25/2023   Had chronic fatigue, hair loss and brittle nails On levothyroxine  25 mcg QD Considering persistent fatigue with high normal TSH, increased dose of levothyroxine  to 50 mcg QD Check TSH and free T4 after 3-4 months

## 2023-04-29 NOTE — Patient Instructions (Signed)
Please start taking Levothyroxine 50 mcg once daily.  Please continue to take other medications as prescribed.  Please continue to follow low carb diet and perform moderate exercise/walking at least 150 mins/week.  Please get fasting blood tests done before the next visit.

## 2023-04-29 NOTE — Assessment & Plan Note (Signed)
Well-controlled with Mometasone cream

## 2023-04-29 NOTE — Assessment & Plan Note (Signed)
Lab Results  Component Value Date   HGBA1C 5.8 (H) 06/10/2022   Advised to follow low-carb diet

## 2023-04-29 NOTE — Assessment & Plan Note (Addendum)
 Takes vitamin D  2000 IU daily Check Vit D level

## 2023-04-29 NOTE — Assessment & Plan Note (Signed)
Improved with Duavee Followed by OB/GYN

## 2023-04-29 NOTE — Assessment & Plan Note (Signed)
Checked lipid profile - LDL 150, improved from prior Cholesterol had improved with diet modification in the past, she prefers to current continue low-cholesterol diet for now

## 2023-05-22 ENCOUNTER — Other Ambulatory Visit: Payer: Self-pay | Admitting: Internal Medicine

## 2023-05-22 DIAGNOSIS — J309 Allergic rhinitis, unspecified: Secondary | ICD-10-CM

## 2023-06-21 ENCOUNTER — Other Ambulatory Visit: Payer: Self-pay | Admitting: Internal Medicine

## 2023-06-21 DIAGNOSIS — J309 Allergic rhinitis, unspecified: Secondary | ICD-10-CM

## 2023-07-21 ENCOUNTER — Other Ambulatory Visit: Payer: Self-pay | Admitting: Internal Medicine

## 2023-07-21 DIAGNOSIS — E039 Hypothyroidism, unspecified: Secondary | ICD-10-CM

## 2023-08-26 LAB — LIPID PANEL
Chol/HDL Ratio: 3 ratio (ref 0.0–4.4)
Cholesterol, Total: 239 mg/dL — ABNORMAL HIGH (ref 100–199)
HDL: 81 mg/dL (ref >39)
LDL Chol Calc (NIH): 142 mg/dL — ABNORMAL HIGH (ref 0–99)
Triglycerides: 95 mg/dL (ref 0–149)
VLDL Cholesterol Cal: 16 mg/dL (ref 5–40)

## 2023-08-26 LAB — CBC WITH DIFFERENTIAL/PLATELET
Basophils Absolute: 0.1 10*3/uL (ref 0.0–0.2)
Basos: 1 %
EOS (ABSOLUTE): 0.1 10*3/uL (ref 0.0–0.4)
Eos: 2 %
Hematocrit: 42.7 % (ref 34.0–46.6)
Hemoglobin: 13.9 g/dL (ref 11.1–15.9)
Immature Grans (Abs): 0 10*3/uL (ref 0.0–0.1)
Immature Granulocytes: 0 %
Lymphocytes Absolute: 1.8 10*3/uL (ref 0.7–3.1)
Lymphs: 39 %
MCH: 31.2 pg (ref 26.6–33.0)
MCHC: 32.6 g/dL (ref 31.5–35.7)
MCV: 96 fL (ref 79–97)
Monocytes Absolute: 0.4 10*3/uL (ref 0.1–0.9)
Monocytes: 8 %
Neutrophils Absolute: 2.3 10*3/uL (ref 1.4–7.0)
Neutrophils: 50 %
Platelets: 165 10*3/uL (ref 150–450)
RBC: 4.45 x10E6/uL (ref 3.77–5.28)
RDW: 11.8 % (ref 11.7–15.4)
WBC: 4.6 10*3/uL (ref 3.4–10.8)

## 2023-08-26 LAB — CMP14+EGFR
ALT: 15 IU/L (ref 0–32)
AST: 19 IU/L (ref 0–40)
Albumin: 4.3 g/dL (ref 3.8–4.9)
Alkaline Phosphatase: 66 IU/L (ref 44–121)
BUN/Creatinine Ratio: 17 (ref 9–23)
BUN: 11 mg/dL (ref 6–24)
Bilirubin Total: 0.3 mg/dL (ref 0.0–1.2)
CO2: 23 mmol/L (ref 20–29)
Calcium: 9.7 mg/dL (ref 8.7–10.2)
Chloride: 105 mmol/L (ref 96–106)
Creatinine, Ser: 0.63 mg/dL (ref 0.57–1.00)
Globulin, Total: 2.7 g/dL (ref 1.5–4.5)
Glucose: 91 mg/dL (ref 70–99)
Potassium: 4.3 mmol/L (ref 3.5–5.2)
Sodium: 141 mmol/L (ref 134–144)
Total Protein: 7 g/dL (ref 6.0–8.5)
eGFR: 107 mL/min/{1.73_m2} (ref >59)

## 2023-08-26 LAB — TSH+T4F+T3FREE
Free T4: 1.47 ng/dL (ref 0.82–1.77)
T3, Free: 3.1 pg/mL (ref 2.0–4.4)
TSH: 0.463 u[IU]/mL (ref 0.450–4.500)

## 2023-08-26 LAB — HEMOGLOBIN A1C
Est. average glucose Bld gHb Est-mCnc: 105 mg/dL
Hgb A1c MFr Bld: 5.3 % (ref 4.8–5.6)

## 2023-08-26 LAB — VITAMIN D 25 HYDROXY (VIT D DEFICIENCY, FRACTURES): Vit D, 25-Hydroxy: 71.8 ng/mL (ref 30.0–100.0)

## 2023-08-28 ENCOUNTER — Ambulatory Visit (INDEPENDENT_AMBULATORY_CARE_PROVIDER_SITE_OTHER): Payer: No Typology Code available for payment source | Admitting: Internal Medicine

## 2023-08-28 ENCOUNTER — Encounter: Payer: Self-pay | Admitting: Internal Medicine

## 2023-08-28 VITALS — BP 126/68 | HR 78 | Ht 71.0 in | Wt 211.0 lb

## 2023-08-28 DIAGNOSIS — E739 Lactose intolerance, unspecified: Secondary | ICD-10-CM | POA: Insufficient documentation

## 2023-08-28 DIAGNOSIS — E782 Mixed hyperlipidemia: Secondary | ICD-10-CM

## 2023-08-28 DIAGNOSIS — E039 Hypothyroidism, unspecified: Secondary | ICD-10-CM

## 2023-08-28 DIAGNOSIS — R195 Other fecal abnormalities: Secondary | ICD-10-CM | POA: Insufficient documentation

## 2023-08-28 DIAGNOSIS — J309 Allergic rhinitis, unspecified: Secondary | ICD-10-CM | POA: Diagnosis not present

## 2023-08-28 MED ORDER — LEVOTHYROXINE SODIUM 50 MCG PO TABS: 50.0000 ug | ORAL_TABLET | Freq: Every day | ORAL | 1 refills | Status: DC

## 2023-08-28 NOTE — Assessment & Plan Note (Signed)
 Lab Results  Component Value Date   TSH 0.463 08/25/2023   Had chronic fatigue, hair loss and brittle nails On levothyroxine  50 mcg QD Considering persistent fatigue with high normal TSH, had increased dose of levothyroxine  to 50 mcg once daily in the last visit - TSH low normal now Check TSH and free T4 after 3-4 months

## 2023-08-28 NOTE — Assessment & Plan Note (Addendum)
 Reports loose BM episodes with bread products at times Check IgA transglutaminase for gluten insensitivity If recurrent episodes of loose BM, will order stool studies and/or get GI evaluation

## 2023-08-28 NOTE — Assessment & Plan Note (Signed)
 Now improved with Xyzal and azelastine nasal spray

## 2023-08-28 NOTE — Assessment & Plan Note (Signed)
 Checked lipid profile - LDL 142, improved from prior Cholesterol had improved with diet modification in the past, she prefers to continue low-cholesterol diet for now

## 2023-08-28 NOTE — Assessment & Plan Note (Signed)
 Had chronic diarrhea, has improved with avoiding lactose containing dairy products Can try lactase supplement

## 2023-08-28 NOTE — Progress Notes (Signed)
 Established Patient Office Visit  Subjective:  Patient ID: Terri Flores, female    DOB: 04-May-1970  Age: 53 y.o. MRN: [id number]  CC:  Chief Complaint  Patient presents with   Medical Management of Chronic Issues    4 month f/u , reports new allergy of lactose.     HPI Terri Flores is a 53 y.o. female with past medical history of allergic sinusitis and chronic fatigue who presents for f/u of her chronic medical conditions.  Hypothyroidism: She has started taking levothyroxine  50 mcg QD.  Her TSH was better - still high-normal in the last visit, with normal free T4 and her dose of Levothyroxine  was increased to 50 mcg from 25 mcg.  Does not report any change in appetite.  Has chronic fatigue, but has improved, compared to prior.  Had hair loss, has been taking hair, skin and nail multivitamin, but has stopped it now.  She had brittle nails, which have also improved now.  She has started Duavee  for menopausal symptoms, has noticed mild improvement.  Denies any vaginal bleeding or discharge.  Eczema: Usually responds well to mometasone  cream, requests refills.  Lactose intolerance: She reports episodes of diarrhea, which were chronic till recently. She has tried to eliminate lactose containing dairy products from her diet, and has noticed improvement in her symptoms.  She also is concerned about loose bowel movement with certain breads and is worried about gluten insensitivity.  Denies melena or hematochezia.   Past Medical History:  Diagnosis Date   Hyperlipidemia    Thyroid  disease     Past Surgical History:  Procedure Laterality Date   APPENDECTOMY     CHOLECYSTECTOMY     CYST REMOVAL HAND Left    OVARIAN CYST REMOVAL Bilateral     Family History  Problem Relation Age of Onset   Obesity Mother    Diabetes Mother    Hyperlipidemia Mother    Other Father        brain tumor   Obesity Sister    Cancer Brother    Hyperlipidemia Brother    Obesity Brother    Heart  attack Paternal Grandfather    Heart attack Maternal Grandmother    Other Maternal Grandfather        severe joint issues   Other Paternal Aunt        brain tumor   Other Paternal Aunt        esophagus exploded    Social History   Socioeconomic History   Marital status: Married    Spouse name: Not on file   Number of children: Not on file   Years of education: Not on file   Highest education level: GED or equivalent  Occupational History   Not on file  Tobacco Use   Smoking status: Former    Types: Cigarettes   Smokeless tobacco: Never  Vaping Use   Vaping status: Never Used  Substance and Sexual Activity   Alcohol use: Not Currently    Comment: once in a blue moon   Drug use: Never   Sexual activity: Not Currently    Birth control/protection: Abstinence  Other Topics Concern   Not on file  Social History Narrative   Not on file   Social Drivers of Health   Financial Resource Strain: Low Risk  (08/27/2023)   Overall Financial Resource Strain (CARDIA)    Difficulty of Paying Living Expenses: Not very hard  Food Insecurity: No Food Insecurity (08/27/2023)   Hunger Vital Sign  Worried About Programme researcher, broadcasting/film/video in the Last Year: Never true    Ran Out of Food in the Last Year: Never true  Transportation Needs: No Transportation Needs (08/27/2023)   PRAPARE - Administrator, Civil Service (Medical): No    Lack of Transportation (Non-Medical): No  Physical Activity: Insufficiently Active (08/27/2023)   Exercise Vital Sign    Days of Exercise per Week: 4 days    Minutes of Exercise per Session: 30 min  Stress: No Stress Concern Present (08/27/2023)   [employer] of Occupational Health - Occupational Stress Questionnaire    Feeling of Stress : Only a little  Social Connections: Socially Isolated (08/27/2023)   Social Connection and Isolation Panel    Frequency of Communication with Friends and Family: Twice a week    Frequency of Social Gatherings  with Friends and Family: Never    Attends Religious Services: Never    Database administrator or Organizations: No    Attends Banker Meetings: Never    Marital Status: Married  Catering manager Violence: Not At Risk (12/26/2022)   Humiliation, Afraid, Rape, and Kick questionnaire    Fear of Current or Ex-Partner: No    Emotionally Abused: No    Physically Abused: No    Sexually Abused: No    Outpatient Medications Prior to Visit  Medication Sig Dispense Refill   Azelastine  HCl 137 MCG/SPRAY SOLN PLACE 2 SPRAYS INTO BOTH NOSTRILS 2 (TWO) TIMES DAILY AS DIRECTED. 30 mL 1   Coenzyme Q10 (COQ10) 100 MG CAPS Take by mouth. Takes 190mg      COLLAGEN PO Take by mouth.     Conj Estrogens-Bazedoxifene (DUAVEE ) 0.45-20 MG TABS Take 1 tablet by mouth daily. 30 tablet 6   levocetirizine (XYZAL ) 5 MG tablet Take 1 tablet (5 mg total) by mouth every evening. 90 tablet 1   MAGNESIUM PO Take 1 capsule by mouth daily.     Multiple Vitamin (MULTIVITAMIN ADULT PO) Take 1 tablet by mouth daily.     Omega-3 Fatty Acids (OMEGA 3 PO) Take by mouth.     POTASSIUM PO Take by mouth. Once or twice a week     TURMERIC PO Take 1 Dose by mouth daily.     VITAMIN D  PO Take by mouth.     VITAMIN D -VITAMIN K PO Take by mouth. Takes 90mcg     VITAMIN E PO Take by mouth.     levothyroxine  (SYNTHROID ) 50 MCG tablet Take 1 tablet (50 mcg total) by mouth daily. 90 tablet 0   No facility-administered medications prior to visit.    Allergies  Allergen Reactions   Lactose Intolerance (Gi) Diarrhea and Other (See Comments)   Other Nausea Only    Pain medication. Patient can't recall the name    ROS Review of Systems  Constitutional:  Positive for fatigue. Negative for chills and fever.  HENT:  Positive for congestion. Negative for sinus pressure and sore throat.   Eyes:  Negative for pain and discharge.  Respiratory:  Negative for cough and shortness of breath.   Cardiovascular:  Negative for chest  pain and palpitations.  Gastrointestinal:  Negative for abdominal pain, nausea and vomiting.  Endocrine: Negative for polydipsia and polyuria.       Hot flashes  Genitourinary:  Negative for dysuria and hematuria.  Musculoskeletal:  Positive for arthralgias (B/l hip pain). Negative for neck pain and neck stiffness.  Skin:  Negative for rash.  Allergic/Immunologic: Positive for  environmental allergies.  Neurological:  Negative for dizziness and weakness.  Psychiatric/Behavioral:  Negative for agitation and behavioral problems.       Objective:    Physical Exam Vitals reviewed.  Constitutional:      General: She is not in acute distress.    Appearance: She is not diaphoretic.  HENT:     Head: Normocephalic and atraumatic.     Nose: No congestion.     Mouth/Throat:     Mouth: Mucous membranes are moist.     Pharynx: No posterior oropharyngeal erythema.   Eyes:     General: No scleral icterus.    Extraocular Movements: Extraocular movements intact.   Neck:     Thyroid : No thyromegaly.   Cardiovascular:     Rate and Rhythm: Normal rate and regular rhythm.     Pulses: Normal pulses.     Heart sounds: Normal heart sounds. No murmur heard. Pulmonary:     Breath sounds: Normal breath sounds. No wheezing or rales.   Musculoskeletal:     Cervical back: Neck supple. No tenderness.     Right lower leg: No edema.     Left lower leg: No edema.   Skin:    General: Skin is warm.     Findings: No erythema.   Neurological:     General: No focal deficit present.     Mental Status: She is alert and oriented to person, place, and time.     Sensory: No sensory deficit.     Motor: No weakness.   Psychiatric:        Mood and Affect: Mood normal.        Behavior: Behavior normal.     BP 126/68   Pulse 78   Ht 5' 11 (1.803 m)   Wt 211 lb (95.7 kg)   SpO2 97%   BMI 29.43 kg/m  Wt Readings from Last 3 Encounters:  08/28/23 211 lb (95.7 kg)  04/29/23 207 lb 6.4 oz (94.1 kg)   03/28/23 208 lb (94.3 kg)    Lab Results  Component Value Date   TSH 0.463 08/25/2023   Lab Results  Component Value Date   WBC 4.6 08/25/2023   HGB 13.9 08/25/2023   HCT 42.7 08/25/2023   MCV 96 08/25/2023   PLT 165 08/25/2023   Lab Results  Component Value Date   NA 141 08/25/2023   K 4.3 08/25/2023   CO2 23 08/25/2023   GLUCOSE 91 08/25/2023   BUN 11 08/25/2023   CREATININE 0.63 08/25/2023   BILITOT 0.3 08/25/2023   ALKPHOS 66 08/25/2023   AST 19 08/25/2023   ALT 15 08/25/2023   PROT 7.0 08/25/2023   ALBUMIN 4.3 08/25/2023   CALCIUM 9.7 08/25/2023   EGFR 107 08/25/2023   Lab Results  Component Value Date   CHOL 239 (H) 08/25/2023   Lab Results  Component Value Date   HDL 81 08/25/2023   Lab Results  Component Value Date   LDLCALC 142 (H) 08/25/2023   Lab Results  Component Value Date   TRIG 95 08/25/2023   Lab Results  Component Value Date   CHOLHDL 3.0 08/25/2023   Lab Results  Component Value Date   HGBA1C 5.3 08/25/2023      Assessment & Plan:   Problem List Items Addressed This Visit       Respiratory   Allergic sinusitis   Now improved with Xyzal  and azelastine  nasal spray        Endocrine  Hypothyroidism - Primary   Lab Results  Component Value Date   TSH 0.463 08/25/2023   Had chronic fatigue, hair loss and brittle nails On levothyroxine  50 mcg QD Considering persistent fatigue with high normal TSH, had increased dose of levothyroxine  to 50 mcg once daily in the last visit - TSH low normal now Check TSH and free T4 after 3-4 months      Relevant Medications   levothyroxine  (SYNTHROID ) 50 MCG tablet   Other Relevant Orders   TSH + free T4     Other   Mixed hyperlipidemia   Checked lipid profile - LDL 142, improved from prior Cholesterol had improved with diet modification in the past, she prefers to continue low-cholesterol diet for now      Relevant Orders   Lipid Profile   Lactose intolerance   Had chronic  diarrhea, has improved with avoiding lactose containing dairy products Can try lactase supplement      Loose bowel movements   Reports loose BM episodes with bread products at times Check IgA transglutaminase for gluten insensitivity If recurrent episodes of loose BM, will order stool studies and/or get GI evaluation      Relevant Orders   Tissue transglutaminase, IgA     Meds ordered this encounter  Medications   levothyroxine  (SYNTHROID ) 50 MCG tablet    Sig: Take 1 tablet (50 mcg total) by mouth daily.    Dispense:  90 tablet    Refill:  1    Follow-up: Return in about 4 months (around 12/28/2023) for Hypothyroidism (after 12/27/23).    Meldon Sport, MD

## 2023-08-28 NOTE — Patient Instructions (Signed)
Please continue to take medications as prescribed.  Please continue to follow low carb diet and perform moderate exercise/walking at least 150 mins/week.  Please get fasting blood tests done before the next visit. 

## 2023-11-01 ENCOUNTER — Other Ambulatory Visit: Payer: Self-pay | Admitting: Adult Health

## 2023-12-01 ENCOUNTER — Other Ambulatory Visit: Payer: Self-pay | Admitting: Internal Medicine

## 2023-12-01 DIAGNOSIS — J309 Allergic rhinitis, unspecified: Secondary | ICD-10-CM

## 2023-12-17 ENCOUNTER — Other Ambulatory Visit: Payer: Self-pay | Admitting: Internal Medicine

## 2023-12-17 DIAGNOSIS — J309 Allergic rhinitis, unspecified: Secondary | ICD-10-CM

## 2023-12-30 ENCOUNTER — Ambulatory Visit (INDEPENDENT_AMBULATORY_CARE_PROVIDER_SITE_OTHER): Admitting: Internal Medicine

## 2023-12-30 VITALS — BP 118/77 | HR 86 | Ht 71.0 in | Wt 218.6 lb

## 2023-12-30 DIAGNOSIS — L309 Dermatitis, unspecified: Secondary | ICD-10-CM | POA: Diagnosis not present

## 2023-12-30 DIAGNOSIS — R7303 Prediabetes: Secondary | ICD-10-CM | POA: Diagnosis not present

## 2023-12-30 DIAGNOSIS — E782 Mixed hyperlipidemia: Secondary | ICD-10-CM

## 2023-12-30 DIAGNOSIS — E559 Vitamin D deficiency, unspecified: Secondary | ICD-10-CM

## 2023-12-30 DIAGNOSIS — Z23 Encounter for immunization: Secondary | ICD-10-CM

## 2023-12-30 DIAGNOSIS — Z0001 Encounter for general adult medical examination with abnormal findings: Secondary | ICD-10-CM | POA: Diagnosis not present

## 2023-12-30 DIAGNOSIS — E039 Hypothyroidism, unspecified: Secondary | ICD-10-CM | POA: Diagnosis not present

## 2023-12-30 MED ORDER — MOMETASONE FUROATE 0.1 % EX CREA: TOPICAL_CREAM | Freq: Every day | CUTANEOUS | 1 refills | Status: AC

## 2023-12-30 MED ORDER — LEVOTHYROXINE SODIUM 50 MCG PO TABS: 50.0000 ug | ORAL_TABLET | Freq: Every day | ORAL | 1 refills | Status: AC

## 2023-12-30 NOTE — Assessment & Plan Note (Signed)
 Lab Results  Component Value Date   HGBA1C 5.3 08/25/2023   Advised to follow low-carb diet

## 2023-12-30 NOTE — Progress Notes (Signed)
 Established Patient Office Visit  Subjective:  Patient ID: Terri Flores, female    DOB: 1970/04/13  Age: 53 y.o. MRN: [id number]  CC:  Chief Complaint  Patient presents with   Hypothyroidism    4 month f/u     HPI Terri Flores is a 53 y.o. female with past medical history of allergic sinusitis and chronic fatigue who presents for f/u of her chronic medical conditions.  Hypothyroidism: She has been taking levothyroxine  50 mcg QD.  Her TSH is WNL now.  Does not report any change in appetite.  Has chronic fatigue, but has improved, compared to prior.  Had hair loss, has been taking hair, skin and nail multivitamin, but has stopped it now.  She had brittle nails, which have also improved now.  She is taking Duavee  for menopausal symptoms, has noticed mild improvement.  Denies any vaginal bleeding or discharge.  Eczema: Usually responds well to mometasone  cream, requests refills.  Lactose intolerance: She had episodes of diarrhea, which were chronic. She has tried to eliminate lactose containing dairy products from her diet, and has noticed improvement in her symptoms.  She also was concerned about loose bowel movement with certain breads, but is tolerating breads now.  Denies melena or hematochezia.   Past Medical History:  Diagnosis Date   Hyperlipidemia    Thyroid  disease     Past Surgical History:  Procedure Laterality Date   APPENDECTOMY     CHOLECYSTECTOMY     CYST REMOVAL HAND Left    OVARIAN CYST REMOVAL Bilateral     Family History  Problem Relation Age of Onset   Obesity Mother    Diabetes Mother    Hyperlipidemia Mother    Other Father        brain tumor   Obesity Sister    Cancer Brother    Hyperlipidemia Brother    Obesity Brother    Heart attack Paternal Grandfather    Heart attack Maternal Grandmother    Other Maternal Grandfather        severe joint issues   Other Paternal Aunt        brain tumor   Other Paternal Aunt        esophagus  exploded    Social History   Socioeconomic History   Marital status: Married    Spouse name: Not on file   Number of children: Not on file   Years of education: Not on file   Highest education level: 12th grade  Occupational History   Not on file  Tobacco Use   Smoking status: Former    Types: Cigarettes   Smokeless tobacco: Never  Vaping Use   Vaping status: Never Used  Substance and Sexual Activity   Alcohol use: Not Currently    Comment: once in a blue moon   Drug use: Never   Sexual activity: Not Currently    Birth control/protection: Abstinence  Other Topics Concern   Not on file  Social History Narrative   Not on file   Social Drivers of Health   Financial Resource Strain: Low Risk  (12/30/2023)   Overall Financial Resource Strain (CARDIA)    Difficulty of Paying Living Expenses: Not hard at all  Food Insecurity: No Food Insecurity (12/30/2023)   Hunger Vital Sign    Worried About Running Out of Food in the Last Year: Never true    Ran Out of Food in the Last Year: Never true  Transportation Needs: No Transportation Needs (12/30/2023)  PRAPARE - Administrator, Civil Service (Medical): No    Lack of Transportation (Non-Medical): No  Physical Activity: Insufficiently Active (12/30/2023)   Exercise Vital Sign    Days of Exercise per Week: 2 days    Minutes of Exercise per Session: 20 min  Stress: No Stress Concern Present (12/30/2023)   [employer] of Occupational Health - Occupational Stress Questionnaire    Feeling of Stress: Only a little  Social Connections: Moderately Isolated (12/30/2023)   Social Connection and Isolation Panel    Frequency of Communication with Friends and Family: More than three times a week    Frequency of Social Gatherings with Friends and Family: Never    Attends Religious Services: Never    Database administrator or Organizations: No    Attends Engineer, structural: Not on file    Marital Status:  Married  Catering manager Violence: Not At Risk (12/26/2022)   Humiliation, Afraid, Rape, and Kick questionnaire    Fear of Current or Ex-Partner: No    Emotionally Abused: No    Physically Abused: No    Sexually Abused: No    Outpatient Medications Prior to Visit  Medication Sig Dispense Refill   Azelastine  HCl 137 MCG/SPRAY SOLN PLACE 2 SPRAYS INTO BOTH NOSTRILS 2 (TWO) TIMES DAILY AS DIRECTED. 30 mL 1   Coenzyme Q10 (COQ10) 100 MG CAPS Take by mouth. Takes 190mg      DUAVEE  0.45-20 MG TABS TAKE 1 TABLET BY MOUTH EVERY DAY 30 tablet 6   levocetirizine (XYZAL ) 5 MG tablet TAKE 1 TABLET BY MOUTH EVERY DAY IN THE EVENING 90 tablet 1   MAGNESIUM PO Take 1 capsule by mouth daily.     Multiple Vitamin (MULTIVITAMIN ADULT PO) Take 1 tablet by mouth daily.     Omega-3 Fatty Acids (OMEGA 3 PO) Take by mouth.     POTASSIUM PO Take by mouth. Once or twice a week     TURMERIC PO Take 1 Dose by mouth daily.     VITAMIN D  PO Take by mouth.     VITAMIN D -VITAMIN K PO Take by mouth. Takes 90mcg     VITAMIN E PO Take by mouth.     levothyroxine  (SYNTHROID ) 50 MCG tablet Take 1 tablet (50 mcg total) by mouth daily. 90 tablet 1   COLLAGEN PO Take by mouth.     No facility-administered medications prior to visit.    Allergies  Allergen Reactions   Lactose Intolerance (Gi) Diarrhea and Other (See Comments)   Other Nausea Only    Pain medication. Patient can't recall the name    ROS Review of Systems  Constitutional:  Positive for fatigue. Negative for chills and fever.  HENT:  Positive for congestion. Negative for sinus pressure and sore throat.   Eyes:  Negative for pain and discharge.  Respiratory:  Negative for cough and shortness of breath.   Cardiovascular:  Negative for chest pain and palpitations.  Gastrointestinal:  Negative for abdominal pain, nausea and vomiting.  Endocrine: Negative for polydipsia and polyuria.       Hot flashes  Genitourinary:  Negative for dysuria and  hematuria.  Musculoskeletal:  Positive for arthralgias (B/l hip pain). Negative for neck pain and neck stiffness.  Skin:  Negative for rash.  Allergic/Immunologic: Positive for environmental allergies.  Neurological:  Negative for dizziness and weakness.  Psychiatric/Behavioral:  Negative for agitation and behavioral problems.       Objective:    Physical Exam  Vitals reviewed.  Constitutional:      General: She is not in acute distress.    Appearance: She is not diaphoretic.  HENT:     Head: Normocephalic and atraumatic.     Nose: No congestion.     Mouth/Throat:     Mouth: Mucous membranes are moist.     Pharynx: No posterior oropharyngeal erythema.  Eyes:     General: No scleral icterus.    Extraocular Movements: Extraocular movements intact.  Neck:     Thyroid : No thyromegaly.  Cardiovascular:     Rate and Rhythm: Normal rate and regular rhythm.     Pulses: Normal pulses.     Heart sounds: Normal heart sounds. No murmur heard. Pulmonary:     Breath sounds: Normal breath sounds. No wheezing or rales.  Musculoskeletal:     Cervical back: Neck supple. No tenderness.     Right lower leg: No edema.     Left lower leg: No edema.  Skin:    General: Skin is warm.     Findings: No erythema.  Neurological:     General: No focal deficit present.     Mental Status: She is alert and oriented to person, place, and time.     Sensory: No sensory deficit.     Motor: No weakness.  Psychiatric:        Mood and Affect: Mood normal.        Behavior: Behavior normal.     BP 118/77   Pulse 86   Ht 5' 11 (1.803 m)   Wt 218 lb 9.6 oz (99.2 kg)   SpO2 98%   BMI 30.49 kg/m  Wt Readings from Last 3 Encounters:  12/30/23 218 lb 9.6 oz (99.2 kg)  08/28/23 211 lb (95.7 kg)  04/29/23 207 lb 6.4 oz (94.1 kg)    Lab Results  Component Value Date   TSH 3.690 12/29/2023   Lab Results  Component Value Date   WBC 4.6 08/25/2023   HGB 13.9 08/25/2023   HCT 42.7 08/25/2023   MCV  96 08/25/2023   PLT 165 08/25/2023   Lab Results  Component Value Date   NA 141 08/25/2023   K 4.3 08/25/2023   CO2 23 08/25/2023   GLUCOSE 91 08/25/2023   BUN 11 08/25/2023   CREATININE 0.63 08/25/2023   BILITOT 0.3 08/25/2023   ALKPHOS 66 08/25/2023   AST 19 08/25/2023   ALT 15 08/25/2023   PROT 7.0 08/25/2023   ALBUMIN 4.3 08/25/2023   CALCIUM 9.7 08/25/2023   EGFR 107 08/25/2023   Lab Results  Component Value Date   CHOL 251 (H) 12/29/2023   Lab Results  Component Value Date   HDL 88 12/29/2023   Lab Results  Component Value Date   LDLCALC 144 (H) 12/29/2023   Lab Results  Component Value Date   TRIG 110 12/29/2023   Lab Results  Component Value Date   CHOLHDL 2.9 12/29/2023   Lab Results  Component Value Date   HGBA1C 5.3 08/25/2023      Assessment & Plan:   Problem List Items Addressed This Visit       Endocrine   Hypothyroidism - Primary   Lab Results  Component Value Date   TSH 3.690 12/29/2023   Had chronic fatigue, hair loss and brittle nails On levothyroxine  50 mcg QD Considering persistent fatigue with high normal TSH, had increased dose of levothyroxine  to 50 mcg once daily previously - TSH wnl now Check TSH  and free T4 after 6 months      Relevant Medications   levothyroxine  (SYNTHROID ) 50 MCG tablet   Other Relevant Orders   TSH + free T4     Musculoskeletal and Integument   Eczema   Well-controlled with Mometasone  cream, refilled      Relevant Medications   mometasone  (ELOCON ) 0.1 % cream   Other Relevant Orders   CBC with Differential/Platelet     Other   Mixed hyperlipidemia   Checked lipid profile - LDL 144, improved from prior, but still elevated Cholesterol had improved with diet modification in the past, she prefers to continue low-cholesterol diet for now Discussed about statin, but she prefers to avoid it      Relevant Orders   Lipid panel   Encounter for general adult medical examination with abnormal  findings   Annual exam as documented. Counseling done  re healthy lifestyle involving commitment to 150 minutes exercise per week, heart healthy diet, and attaining healthy weight.The importance of adequate sleep also discussed. Immunization and cancer screening needs are specifically addressed at this visit.  Had home stool test - cologuard in 11/24.      Prediabetes   Lab Results  Component Value Date   HGBA1C 5.3 08/25/2023   Advised to follow low-carb diet      Relevant Orders   Hemoglobin A1c   CMP14+EGFR   Vitamin D  deficiency   Takes vitamin D  2000 IU daily Check Vit D level      Relevant Orders   VITAMIN D  25 Hydroxy (Vit-D Deficiency, Fractures)   Other Visit Diagnoses       Encounter for immunization       Relevant Orders   Tdap vaccine greater than or equal to 7yo IM (Completed)   Flu vaccine trivalent PF, 6mos and older(Flulaval,Afluria,Fluarix,Fluzone) (Completed)         Meds ordered this encounter  Medications   levothyroxine  (SYNTHROID ) 50 MCG tablet    Sig: Take 1 tablet (50 mcg total) by mouth daily.    Dispense:  90 tablet    Refill:  1   mometasone  (ELOCON ) 0.1 % cream    Sig: Apply topically daily.    Dispense:  45 g    Refill:  1    Follow-up: Return in about 6 months (around 06/29/2024) for Hypothyroidism.    Suzzane MARLA Blanch, MD

## 2023-12-30 NOTE — Patient Instructions (Signed)
 Please continue to take medications as prescribed.  Please continue to follow low carb diet and perform moderate exercise/walking at least 150 mins/week.  Please get fasting blood tests done before the next visit.

## 2023-12-30 NOTE — Assessment & Plan Note (Addendum)
 Annual exam as documented. Counseling done  re healthy lifestyle involving commitment to 150 minutes exercise per week, heart healthy diet, and attaining healthy weight.The importance of adequate sleep also discussed. Immunization and cancer screening needs are specifically addressed at this visit.  Had home stool test - cologuard in 11/24.

## 2023-12-30 NOTE — Assessment & Plan Note (Signed)
 Lab Results  Component Value Date   TSH 3.690 12/29/2023   Had chronic fatigue, hair loss and brittle nails On levothyroxine  50 mcg QD Considering persistent fatigue with high normal TSH, had increased dose of levothyroxine  to 50 mcg once daily previously - TSH wnl now Check TSH and free T4 after 6 months

## 2023-12-30 NOTE — Assessment & Plan Note (Signed)
 Well-controlled with Mometasone  cream, refilled

## 2023-12-30 NOTE — Assessment & Plan Note (Signed)
 Takes vitamin D  2000 IU daily Check Vit D level

## 2023-12-30 NOTE — Assessment & Plan Note (Signed)
 Checked lipid profile - LDL 144, improved from prior, but still elevated Cholesterol had improved with diet modification in the past, she prefers to continue low-cholesterol diet for now Discussed about statin, but she prefers to avoid it

## 2024-01-02 LAB — LIPID PANEL
Chol/HDL Ratio: 2.9 ratio (ref 0.0–4.4)
Cholesterol, Total: 251 mg/dL — ABNORMAL HIGH (ref 100–199)
HDL: 88 mg/dL (ref >39)
LDL Chol Calc (NIH): 144 mg/dL — ABNORMAL HIGH (ref 0–99)
Triglycerides: 110 mg/dL (ref 0–149)
VLDL Cholesterol Cal: 19 mg/dL (ref 5–40)

## 2024-01-02 LAB — TSH+FREE T4
Free T4: 1.16 ng/dL (ref 0.82–1.77)
TSH: 3.69 u[IU]/mL (ref 0.450–4.500)

## 2024-01-02 LAB — TISSUE TRANSGLUTAMINASE, IGA: Transglutaminase IgA: 3 U/mL (ref 0–3)

## 2024-01-16 ENCOUNTER — Other Ambulatory Visit: Payer: Self-pay | Admitting: Internal Medicine

## 2024-01-16 DIAGNOSIS — J309 Allergic rhinitis, unspecified: Secondary | ICD-10-CM

## 2024-02-11 ENCOUNTER — Other Ambulatory Visit: Payer: Self-pay | Admitting: Internal Medicine

## 2024-02-11 DIAGNOSIS — J309 Allergic rhinitis, unspecified: Secondary | ICD-10-CM

## 2024-03-02 ENCOUNTER — Encounter: Payer: Self-pay | Admitting: Adult Health

## 2024-03-02 ENCOUNTER — Ambulatory Visit: Admitting: Adult Health

## 2024-03-02 VITALS — BP 130/84 | HR 74 | Ht 71.0 in | Wt 226.0 lb

## 2024-03-02 DIAGNOSIS — Z01419 Encounter for gynecological examination (general) (routine) without abnormal findings: Secondary | ICD-10-CM | POA: Insufficient documentation

## 2024-03-02 DIAGNOSIS — N816 Rectocele: Secondary | ICD-10-CM | POA: Insufficient documentation

## 2024-03-02 DIAGNOSIS — Z1211 Encounter for screening for malignant neoplasm of colon: Secondary | ICD-10-CM | POA: Insufficient documentation

## 2024-03-02 DIAGNOSIS — Z79890 Hormone replacement therapy (postmenopausal): Secondary | ICD-10-CM | POA: Insufficient documentation

## 2024-03-02 LAB — HEMOCCULT GUIAC POC 1CARD (OFFICE): Fecal Occult Blood, POC: NEGATIVE

## 2024-03-02 MED ORDER — DUAVEE 0.45-20 MG PO TABS: 1.0000 | ORAL_TABLET | Freq: Every day | ORAL | 12 refills | Status: AC

## 2024-03-02 NOTE — Progress Notes (Signed)
 Patient ID: Terri Flores, female   DOB: [birth year], 53 y.o.   MRN: [id number] History of Present Illness:  Terri Flores is a 53 year old white female, married, PM in for a well woman gyn exam.      Component Value Date/Time   DIAGPAP  12/26/2022 0844    - Negative for intraepithelial lesion or malignancy (NILM)   DIAGPAP  01/05/2020 0914    - Negative for intraepithelial lesion or malignancy (NILM)   HPVHIGH Negative 12/26/2022 0844   HPVHIGH Negative 01/05/2020 0914   ADEQPAP  12/26/2022 0844    Satisfactory for evaluation; transformation zone component ABSENT.   ADEQPAP  01/05/2020 0914    Satisfactory for evaluation; transformation zone component ABSENT.    PCP is Dr Tobie  Current Medications, Allergies, Past Medical History, Past Surgical History, Family History and Social History were reviewed in [employer] electronic medical record.     Review of Systems: Patient denies any headaches, hearing loss, fatigue, blurred vision, shortness of breath, chest pain, abdominal pain, problems with bowel movements(has to lean at times to get BM out), urination, or intercourse(not active). No joint pain or mood swings.  Hot flashes much better on Duavee   Denies any vaginal bleeding    Physical Exam:BP 130/84 (BP Location: Left Arm, Patient Position: Sitting, Cuff Size: Large)   Pulse 74   Ht 5' 11 (1.803 m)   Wt 226 lb (102.5 kg)   LMP 09/11/2022 Comment: skipping periods  BMI 31.52 kg/m   General:  Well developed, well nourished, no acute distress Skin:  Warm and dry Neck:  Midline trachea, normal thyroid , good ROM, no lymphadenopathy Lungs; Clear to auscultation bilaterally Breast:  No dominant palpable mass, retraction, or nipple discharge Cardiovascular: Regular rate and rhythm Abdomen:  Soft, non tender, no hepatosplenomegaly Pelvic:  External genitalia is normal in appearance, no lesions.  The vagina is normal in appearance. Urethra has no lesions or masses. The cervix is  smooth.  Uterus is felt to be normal size, shape, and contour.  No adnexal masses or tenderness noted.Bladder is non tender, no masses felt. Rectal: Good sphincter tone, no polyps, or hemorrhoids felt.  Hemoccult negative. Has low rectocele and small skin tag neat anal area Extremities/musculoskeletal:  No swelling or varicosities noted, no clubbing or cyanosis Psych:  No mood changes, alert and cooperative,seems happy AA is 0 Fall risk is low    03/02/2024    1:30 PM 12/30/2023   10:43 AM 08/28/2023   10:29 AM  Depression screen PHQ 2/9  Decreased Interest 0 0 0  Down, Depressed, Hopeless 0 0 0  PHQ - 2 Score 0 0 0  Altered sleeping 0 0 0  Tired, decreased energy 0 0 0  Change in appetite 0 0 0  Feeling bad or failure about yourself  0 0 0  Trouble concentrating 0 0 0  Moving slowly or fidgety/restless 0 0 0  Suicidal thoughts 0 0 0  PHQ-9 Score 0 0  0   Difficult doing work/chores  Not difficult at all Not difficult at all     Data saved with a previous flowsheet row definition       03/02/2024    1:30 PM 12/30/2023   10:43 AM 08/28/2023   10:30 AM 04/29/2023   10:20 AM  GAD 7 : Generalized Anxiety Score  Nervous, Anxious, on Edge 0 0 0 0  Control/stop worrying 0 0 0 0  Worry too much - different things 0 0 0 0  Trouble relaxing 0 0 0 0  Restless 0 0 0 0  Easily annoyed or irritable 1 0 0 0  Afraid - awful might happen 0 0 0 0  Total GAD 7 Score 1 0 0 0  Anxiety Difficulty  Not difficult at all Not difficult at all Not difficult at all    Upstream - 03/02/24 1328       Pregnancy Intention Screening   Does the patient want to become pregnant in the next year? N/A    Does the patient's partner want to become pregnant in the next year? N/A    Would the patient like to discuss contraceptive options today? N/A      Contraception Wrap Up   Current Method Abstinence;Post-Menopause    End Method Abstinence;Post-Menopause    Contraception Counseling Provided No            Examination chaperoned by Clarita Salt LPN  Impression and plan: 1. Encounter for well woman exam with routine gynecological exam (Primary) Pap in 2027 Physical in 1 year Labs with PCP Mammogram was negative 07/26/22, she usually gets on mobile unit Had negative cologuard 02/06/23  2. Rectocele Gave medical explainer #4  3. Encounter for screening fecal occult blood testing Hemoccult was negative  - POCT occult blood stool  4. Hormone replacement therapy (HRT) Happy with Duavee , will refill Meds ordered this encounter  Medications   Conj Estrogens-Bazedoxifene (DUAVEE ) 0.45-20 MG TABS    Sig: Take 1 tablet by mouth daily.    Dispense:  30 tablet    Refill:  12    Supervising Provider:   JAYNE MINDER H [[id number]]

## 2024-03-20 ENCOUNTER — Other Ambulatory Visit: Payer: Self-pay | Admitting: Internal Medicine

## 2024-03-20 DIAGNOSIS — J309 Allergic rhinitis, unspecified: Secondary | ICD-10-CM

## 2024-04-22 ENCOUNTER — Other Ambulatory Visit: Payer: Self-pay | Admitting: Internal Medicine

## 2024-04-22 DIAGNOSIS — J309 Allergic rhinitis, unspecified: Secondary | ICD-10-CM

## 2024-06-29 ENCOUNTER — Ambulatory Visit: Admitting: Internal Medicine
